# Patient Record
Sex: Female | Born: 1935 | Race: White | Hispanic: No | Smoking: Former smoker
Health system: Southern US, Community
[De-identification: ages and names within clinical notes are randomized; demographics above are authoritative.]

## PROBLEM LIST (undated history)

## (undated) DIAGNOSIS — J449 Chronic obstructive pulmonary disease, unspecified: Secondary | ICD-10-CM

## (undated) DIAGNOSIS — I10 Essential (primary) hypertension: Secondary | ICD-10-CM

## (undated) DIAGNOSIS — I4891 Unspecified atrial fibrillation: Secondary | ICD-10-CM

## (undated) DIAGNOSIS — Z950 Presence of cardiac pacemaker: Secondary | ICD-10-CM

## (undated) HISTORY — DX: Unspecified atrial fibrillation: I48.91

## (undated) HISTORY — PX: HERNIA REPAIR: SHX51

## (undated) HISTORY — PX: LAPAROSCOPIC HYSTERECTOMY: SHX1926

## (undated) HISTORY — PX: PHRENIC NERVE PACEMAKER IMPLANTATION: SHX2237

## (undated) HISTORY — DX: Essential (primary) hypertension: I10

## (undated) HISTORY — DX: Presence of cardiac pacemaker: Z95.0

## (undated) HISTORY — PX: CHOLECYSTECTOMY: SHX55

---

## 2008-11-27 HISTORY — PX: PACEMAKER PLACEMENT: SHX43

## 2013-09-27 DEATH — deceased

## 2015-08-26 ENCOUNTER — Ambulatory Visit (INDEPENDENT_AMBULATORY_CARE_PROVIDER_SITE_OTHER): Payer: Medicare Other | Admitting: Cardiology

## 2015-08-26 ENCOUNTER — Encounter: Payer: Self-pay | Admitting: Cardiology

## 2015-08-26 ENCOUNTER — Encounter: Payer: Self-pay | Admitting: *Deleted

## 2015-08-26 VITALS — BP 108/60 | HR 77 | Ht 60.0 in | Wt 103.4 lb

## 2015-08-26 DIAGNOSIS — Z45010 Encounter for checking and testing of cardiac pacemaker pulse generator [battery]: Secondary | ICD-10-CM

## 2015-08-26 DIAGNOSIS — Z79899 Other long term (current) drug therapy: Secondary | ICD-10-CM | POA: Diagnosis not present

## 2015-08-26 DIAGNOSIS — I495 Sick sinus syndrome: Secondary | ICD-10-CM | POA: Diagnosis not present

## 2015-08-26 DIAGNOSIS — I480 Paroxysmal atrial fibrillation: Secondary | ICD-10-CM | POA: Diagnosis not present

## 2015-08-26 DIAGNOSIS — I4891 Unspecified atrial fibrillation: Secondary | ICD-10-CM | POA: Insufficient documentation

## 2015-08-26 DIAGNOSIS — I10 Essential (primary) hypertension: Secondary | ICD-10-CM | POA: Diagnosis not present

## 2015-08-26 DIAGNOSIS — I482 Chronic atrial fibrillation, unspecified: Secondary | ICD-10-CM

## 2015-08-26 LAB — CUP PACEART INCLINIC DEVICE CHECK
Battery Remaining Longevity: 0 mo
Date Time Interrogation Session: 20160929144354
Lead Channel Impedance Value: 342 Ohm
Lead Channel Pacing Threshold Amplitude: 1.25 V
Lead Channel Pacing Threshold Pulse Width: 0.4 ms
Lead Channel Setting Sensing Sensitivity: 1.5 mV
MDC IDC MSMT BATTERY IMPEDANCE: 24300 Ohm
MDC IDC MSMT BATTERY VOLTAGE: 2.51 V
MDC IDC MSMT LEADCHNL RA IMPEDANCE VALUE: 318 Ohm
MDC IDC MSMT LEADCHNL RA PACING THRESHOLD AMPLITUDE: 0.75 V
MDC IDC MSMT LEADCHNL RA PACING THRESHOLD PULSEWIDTH: 0.4 ms
MDC IDC MSMT LEADCHNL RA SENSING INTR AMPL: 5 mV
MDC IDC SET LEADCHNL RA PACING AMPLITUDE: 2 V
MDC IDC SET LEADCHNL RV PACING AMPLITUDE: 2.75 V
MDC IDC SET LEADCHNL RV PACING PULSEWIDTH: 0.4 ms
MDC IDC STAT BRADY RA PERCENT PACED: 52 %
MDC IDC STAT BRADY RV PERCENT PACED: 99 %
Pulse Gen Model: 5816
Pulse Gen Serial Number: 7006169

## 2015-08-26 LAB — BASIC METABOLIC PANEL
BUN: 22 mg/dL (ref 6–23)
CHLORIDE: 104 meq/L (ref 96–112)
CO2: 29 meq/L (ref 19–32)
CREATININE: 0.57 mg/dL (ref 0.40–1.20)
Calcium: 9 mg/dL (ref 8.4–10.5)
GFR: 106.9 mL/min (ref >60.00)
Glucose, Bld: 79 mg/dL (ref 70–99)
POTASSIUM: 4 meq/L (ref 3.5–5.1)
Sodium: 138 mEq/L (ref 135–145)

## 2015-08-26 NOTE — Patient Instructions (Signed)
Medication Instructions:  Your physician recommends that you continue on your current medications as directed. Please refer to the Current Medication list given to you today.  Labwork: Today: BMET  Your physician recommends that you return for pre procedure lab work on 09/10/15.   Testing/Procedures: Your physician has recommended that you have a pacemaker generator change.  Please see the instruction sheet given to you today for more information.  Follow-Up: Your physician recommends that you schedule a follow-up appointment in: 10-14 days after pacemaker change on 09/13/15.  Any Other Special Instructions Will Be Listed Below (If Applicable). Here is a list of anticoagulations medications.  Please check on prices and call office with medication you would like to start. : Xarelto Eliquis Savaysa Pradaxa Coumadin   Thank you for choosing [facility]!!   Dory Horn, RN [phone]    Pacemaker Battery Change A pacemaker battery usually lasts 4 to 12 years. Once or twice per year, you will be asked to visit your health care provider to have a full evaluation of your pacemaker. When a battery needs to be replaced, the entire pacemaker is replaced so that you can benefit from new circuitry and any new features that have been added to pacemakers. Most often, this procedure is very simple because the leads are already in place.  There are many things that affect how long a pacemaker battery will last, including:   The age of the pacemaker.   The number of leads (1, 2, or 3).   The pacemaker work load. If the pacemaker is helping the heart more often, the battery will not last as long as it would if the pacemaker did not need to help the heart.   Power (voltage) settings. LET [facility] CARE PROVIDER KNOW ABOUT:   Any allergies you have.   All medicines you are taking, including vitamins, herbs, eye drops, creams, and over-the-counter medicines.   Previous  problems you or members of your family have had with the use of anesthetics.   Any blood disorders you have.   Previous surgeries you have had, especially since your last pacemaker placement.   Medical conditions you have.   Possibility of pregnancy, if this applies.  Symptoms of chest pain, trouble breathing, palpitations, light-headedness, or feelings of an abnormal or irregular heartbeat. RISKS AND COMPLICATIONS  Generally, this is a safe procedure. However, as with any procedure, problems can occur and include:   Bleeding.   Bruising of the skin around where the incision was made.   Pain at the incision site.   Pulling apart of the skin at the incision site.   Infection.   Allergic reaction to anesthetics or other medicines used during the procedure.  People with diabetes may have a temporary increase in their blood sugar after any surgical procedure.  BEFORE THE PROCEDURE   Wash all of the skin around the area of the chest where the pacemaker is located.   Ask your health care provider for help with any medicine adjustments before the pacemaker is replaced.   Do not eat or drink anything after midnight on the night before the procedure or as directed by your health care provider.  Ask your health care provider if you can take a sip of water with any approved medicines the morning of the procedure. PROCEDURE   After giving medicine to numb the skin (local anesthetic), your health care provider will make a cut to reopen the pocket holding the pacemaker.   The old  pacemaker will be disconnected from its leads.   The leads will be tested.   If needed, the leads will be replaced. If the leads are functioning properly, the new pacemaker may be connected to the existing leads.  A heart monitor and the pacemaker programmer will be used to make sure that the new pacemaker is working properly.  The incision site will then be closed. A dressing will be placed  over the pacemaker site. The dressing will be removed 24-48 hours afterward. AFTER THE PROCEDURE   You will be taken to a recovery area after the new pacemaker implant is completed. Your vital signs such as blood pressure, heart rate, breathing, and oxygen levels will be monitored.  Your health care provider will tell you when you will need to next test your pacemaker or when to return to the office for follow-up for removal of stitches. Document Released: 02/21/2007 Document Revised: 03/30/2014 Document Reviewed: 05/28/2013 [facility] Patient Information 2015 [place], [place]. This information is not intended to replace advice given to you by your health care provider. Make sure you discuss any questions you have with your health care provider.

## 2015-08-26 NOTE — Progress Notes (Signed)
Electrophysiology Office Note   Date:  08/26/2015   ID:  Brooke Curry, DOB [birth year], MRN [id number]  PCP:  PROVIDER NOT IN SYSTEM  Primary Electrophysiologist: Gabriele Zwilling Jorja Loa, MD    No chief complaint on file.    History of Present Illness: Brooke Curry is a 81 y.o. female who presents today for electrophysiology evaluation.   On 9/22, she presented to the hospital with shortness of breath and was found to have CAP.  She was at that time found to have a cavitary lung lesion.  She had washout of the lesion and cultures sent.  She was sent to SNF.  Her PPM was routinely checked and found to be ERI since 6/16.  The St. Jude dual chamber pacemaker was implanted in 2010 for sick sinus syndrome.  She also has AF and has had AV node ablation in 2014.     Today, she denies symptoms of palpitations, chest pain, shortness of breath, orthopnea, PND, lower extremity edema, claudication, dizziness, presyncope, syncope, bleeding, or neurologic sequela. The patient is tolerating medications without difficulties and is otherwise without complaint today.  She is feeling much improved since her hospitalization.  She is at a SNF in town and is planning on moving to [place] in the future to be closer to family.  Her last dose of antibiotics Cahterine Heinzel be on Sunday.  She is in a wheelchair today but is usually up and around without difficulties.  She is able to walk to the bathroom currently.       Past Medical History  Diagnosis Date  . Pacemaker   . A-fib   . Hypertension    Past Surgical History  Procedure Laterality Date  . Phrenic nerve pacemaker implantation    . Laparoscopic hysterectomy    . Cholecystectomy    . Pacemaker placement  2010  . Hernia repair       Current Outpatient Prescriptions  Medication Sig Dispense Refill  . aspirin 81 MG tablet Take 81 mg by mouth daily.    . cefUROXime (CEFTIN) 500 MG tablet Take 500 mg by mouth 2 (two) times daily with a meal.    . LORazepam  (ATIVAN) 0.5 MG tablet Take 0.5 mg by mouth every 8 (eight) hours.    . metoprolol tartrate (LOPRESSOR) 25 MG tablet Take 25 mg by mouth 2 (two) times daily.    . Multiple Vitamins-Minerals (MULTI COMPLETE PO) Take 1 capsule by mouth daily.    . pantoprazole (PROTONIX) 40 MG tablet Take 40 mg by mouth daily.    . polyethylene glycol (MIRALAX / GLYCOLAX) packet Take 17 g by mouth daily.    Marland Kitchen senna (SENOKOT) 8.6 MG tablet Take 1 tablet by mouth daily.     No current facility-administered medications for this visit.    Allergies:   Review of patient's allergies indicates no known allergies.   Social History:  The patient  reports that she has quit smoking. She does not have any smokeless tobacco history on file. She reports that she drinks alcohol. She reports that she uses illicit drugs.   Family History:  The patient's family history includes Cancer in her brother and mother; Heart disease in her brother and father.    ROS:  Please see the history of present illness.   Otherwise, review of systems is positive for shortness of breath with exertion.   All other systems are reviewed and negative.    PHYSICAL EXAM: VS:  BP 108/60 mmHg  Pulse 77  Ht 5' (1.524 m)  Wt 103 lb 6.4 oz (46.902 kg)  BMI 20.19 kg/m2 , BMI Body mass index is 20.19 kg/(m^2). GEN: Well nourished, well developed, in no acute distress HEENT: normal Neck: no JVD, carotid bruits, or masses Cardiac: RRR; no murmurs, rubs, or gallops,no edema  Respiratory:  clear to auscultation bilaterally, normal work of breathing GI: soft, nontender, nondistended, + BS MS: no deformity or atrophy Skin: warm and dry, device pocket is well healed Neuro:  Strength and sensation are intact Psych: euthymic mood, full affect  EKG:  EKG is ordered today. The ekg ordered today shows sinus rhythm, V paced  Device interrogation is reviewed today in detail.  See PaceArt for details.   7% AF   Recent Labs: No results found for requested  labs within last 365 days.    Lipid Panel  No results found for: CHOL, TRIG, HDL, CHOLHDL, VLDL, LDLCALC, LDLDIRECT   Wt Readings from Last 3 Encounters:  08/26/15 103 lb 6.4 oz (46.902 kg)      Other studies Reviewed: Additional studies/ records that were reviewed today include: TTE 08/15/15  Review of the above records today demonstrates: Mild-mod AI, PASP 50, EF 65%,    ASSESSMENT AND PLAN:  1.  Symptomatic sick sinus syndrome: S/p St. Jude dual chamber pacemaker in 2010 with AV nodal ablation in 2014.  Device is currently at [facility].  2. Atrial fibrillation: CHADS2 VA2Sc score of 4 which puts her at risk of stroke.  Had discussion on risks of stroke and she would like st start on anticoagulation.  Her and her family would like to begin some sort of anticoagulation.  Have given them information on anticoagulants and prices.  She Joellen Tullos discuss with her family and figure out a good medication.  Miriam Kestler get a BMP to check kidney function.  This patients CHA2DS2-VASc Score and unadjusted Ischemic Stroke Rate (% per year) is equal to 4.8 % stroke rate/year from a score of 4  Above score calculated as 1 point each if present [CHF, HTN, DM, Vascular=MI/PAD/Aortic Plaque, Age if 65-74, or Female] Above score calculated as 2 points each if present [Age > 75, or Stroke/TIA/TE]  Current medicines are reviewed at length with the patient today.   The patient does not have concerns regarding her medicines.  The following changes were made today:  Sent home with information for anticoagulation  Labs/ tests ordered today include: generator change, BMP  No orders of the defined types were placed in this encounter.     Disposition:   FU with Sheelah Ritacco post gen change 3 months  Signed, Ernesta Trabert Jorja Loa, MD  08/26/2015 12:28 PM     [facility] [address] [place] [address] [phone] (office) [phone] (fax)

## 2015-09-01 ENCOUNTER — Telehealth: Payer: Self-pay | Admitting: Cardiology

## 2015-09-01 NOTE — Telephone Encounter (Signed)
New message      Talk to [facility] regarding upcoming procedure schedule for 10-17.  Son is aware Brooke Curry is out today, OK to wait until she returns.

## 2015-09-02 NOTE — Telephone Encounter (Signed)
Pt's son called needing to cancel PPM generator change on 10/17.   Pt's mother got out of [facility] rehab early and he does not have a room for her to stay here in.  So she is going back to [facility] for now and will have procedure there.   Son apologized several times for this and I assured him this was ok and as long as patient is being taken care of, that is the most important thing, no matter where that is.  They will call us once patient has relocated back to this area and we will re-establish f/u then.

## 2015-09-03 NOTE — Telephone Encounter (Signed)
Dr. Elberta Fortis notified

## 2015-09-10 ENCOUNTER — Other Ambulatory Visit: Payer: Medicare Other

## 2015-09-13 ENCOUNTER — Encounter (HOSPITAL_COMMUNITY): Admission: RE | Payer: Self-pay

## 2015-09-13 ENCOUNTER — Ambulatory Visit (HOSPITAL_COMMUNITY): Admission: RE | Admit: 2015-09-13 | Payer: Medicare Other

## 2015-09-13 SURGERY — PPM/BIV PPM GENERATOR CHANGEOUT
Anesthesia: LOCAL

## 2015-09-22 ENCOUNTER — Encounter: Payer: Self-pay | Admitting: Cardiology

## 2015-09-23 ENCOUNTER — Ambulatory Visit: Payer: Medicare Other

## 2015-09-23 ENCOUNTER — Encounter: Payer: Self-pay | Admitting: Cardiology

## 2015-10-15 ENCOUNTER — Emergency Department (HOSPITAL_COMMUNITY): Payer: Medicare Other

## 2015-10-15 ENCOUNTER — Encounter (HOSPITAL_COMMUNITY): Payer: Self-pay | Admitting: *Deleted

## 2015-10-15 ENCOUNTER — Emergency Department (HOSPITAL_COMMUNITY)
Admission: EM | Admit: 2015-10-15 | Discharge: 2015-10-16 | Disposition: A | Discharge status: Home / Self Care | Payer: Medicare Other | Attending: Emergency Medicine | Admitting: Emergency Medicine

## 2015-10-15 DIAGNOSIS — J439 Emphysema, unspecified: Secondary | ICD-10-CM | POA: Diagnosis not present

## 2015-10-15 DIAGNOSIS — I10 Essential (primary) hypertension: Secondary | ICD-10-CM | POA: Insufficient documentation

## 2015-10-15 DIAGNOSIS — R0902 Hypoxemia: Secondary | ICD-10-CM | POA: Diagnosis not present

## 2015-10-15 DIAGNOSIS — Z79899 Other long term (current) drug therapy: Secondary | ICD-10-CM | POA: Insufficient documentation

## 2015-10-15 DIAGNOSIS — I4891 Unspecified atrial fibrillation: Secondary | ICD-10-CM | POA: Insufficient documentation

## 2015-10-15 DIAGNOSIS — Z87891 Personal history of nicotine dependence: Secondary | ICD-10-CM | POA: Insufficient documentation

## 2015-10-15 DIAGNOSIS — Z950 Presence of cardiac pacemaker: Secondary | ICD-10-CM | POA: Insufficient documentation

## 2015-10-15 DIAGNOSIS — Z7982 Long term (current) use of aspirin: Secondary | ICD-10-CM | POA: Diagnosis not present

## 2015-10-15 DIAGNOSIS — R0602 Shortness of breath: Secondary | ICD-10-CM | POA: Diagnosis present

## 2015-10-15 LAB — CBC WITH DIFFERENTIAL/PLATELET
BASOS ABS: 0 10*3/uL (ref 0.0–0.1)
Basophils Relative: 0 %
EOS ABS: 0 10*3/uL (ref 0.0–0.7)
EOS PCT: 1 %
HCT: 39 % (ref 36.0–46.0)
Hemoglobin: 12.5 g/dL (ref 12.0–15.0)
LYMPHS PCT: 8 %
Lymphs Abs: 0.6 10*3/uL — ABNORMAL LOW (ref 0.7–4.0)
MCH: 31.4 pg (ref 26.0–34.0)
MCHC: 32.1 g/dL (ref 30.0–36.0)
MCV: 98 fL (ref 78.0–100.0)
Monocytes Absolute: 1 10*3/uL (ref 0.1–1.0)
Monocytes Relative: 13 %
Neutro Abs: 6 10*3/uL (ref 1.7–7.7)
Neutrophils Relative %: 78 %
PLATELETS: 100 10*3/uL — AB (ref 150–400)
RBC: 3.98 MIL/uL (ref 3.87–5.11)
RDW: 14.9 % (ref 11.5–15.5)
WBC: 7.7 10*3/uL (ref 4.0–10.5)

## 2015-10-15 LAB — COMPREHENSIVE METABOLIC PANEL
ALT: 26 U/L (ref 14–54)
AST: 39 U/L (ref 15–41)
Albumin: 3 g/dL — ABNORMAL LOW (ref 3.5–5.0)
Alkaline Phosphatase: 92 U/L (ref 38–126)
Anion gap: 10 (ref 5–15)
BILIRUBIN TOTAL: 1 mg/dL (ref 0.3–1.2)
BUN: 20 mg/dL (ref 6–20)
CO2: 24 mmol/L (ref 22–32)
CREATININE: 0.66 mg/dL (ref 0.44–1.00)
Calcium: 8.7 mg/dL — ABNORMAL LOW (ref 8.9–10.3)
Chloride: 100 mmol/L — ABNORMAL LOW (ref 101–111)
Glucose, Bld: 113 mg/dL — ABNORMAL HIGH (ref 65–99)
POTASSIUM: 4.2 mmol/L (ref 3.5–5.1)
Sodium: 134 mmol/L — ABNORMAL LOW (ref 135–145)
TOTAL PROTEIN: 6.7 g/dL (ref 6.5–8.1)

## 2015-10-15 LAB — I-STAT TROPONIN, ED: TROPONIN I, POC: 0.01 ng/mL (ref 0.00–0.08)

## 2015-10-15 MED ORDER — ALBUTEROL SULFATE (2.5 MG/3ML) 0.083% IN NEBU
5.0000 mg | INHALATION_SOLUTION | Freq: Once | RESPIRATORY_TRACT | Status: AC
  Administered 2015-10-15: 5 mg via RESPIRATORY_TRACT
  Filled 2015-10-15: qty 6

## 2015-10-15 MED ORDER — LORAZEPAM 0.5 MG PO TABS
0.5000 mg | ORAL_TABLET | Freq: Every day | ORAL | Status: DC
  Administered 2015-10-15: 0.5 mg via ORAL
  Filled 2015-10-15: qty 1

## 2015-10-15 MED ORDER — METOPROLOL TARTRATE 25 MG PO TABS
25.0000 mg | ORAL_TABLET | Freq: Two times a day (BID) | ORAL | Status: DC
  Administered 2015-10-15: 25 mg via ORAL
  Filled 2015-10-15: qty 1

## 2015-10-15 MED ORDER — ATORVASTATIN CALCIUM 10 MG PO TABS: 10.0000 mg | ORAL_TABLET | Freq: Every day | ORAL | Status: DC

## 2015-10-15 MED ORDER — IPRATROPIUM-ALBUTEROL 0.5-2.5 (3) MG/3ML IN SOLN
3.0000 mL | RESPIRATORY_TRACT | Status: DC | PRN
  Administered 2015-10-15 – 2015-10-16 (×2): 3 mL via RESPIRATORY_TRACT
  Filled 2015-10-15 (×2): qty 3

## 2015-10-15 MED ORDER — ASPIRIN 81 MG PO TABS: 81.0000 mg | ORAL_TABLET | Freq: Every day | ORAL | Status: DC

## 2015-10-15 MED ORDER — FAMOTIDINE 20 MG PO TABS
20.0000 mg | ORAL_TABLET | Freq: Every day | ORAL | Status: DC
  Administered 2015-10-15 – 2015-10-16 (×2): 20 mg via ORAL
  Filled 2015-10-15 (×2): qty 1

## 2015-10-15 NOTE — ED Notes (Signed)
Pt back from x-ray.

## 2015-10-15 NOTE — ED Notes (Signed)
Leotis ShamesBob Halk- Patient son would like to be notified of any pertinent information admission/discharge. cell # [phone] Antonieta PertJulie Lehn- cell # [phone]

## 2015-10-15 NOTE — ED Provider Notes (Signed)
CSN: [id number]     Arrival date & time 10/15/15  1635 History   First MD Initiated Contact with Patient 10/15/15 1639     Chief Complaint  Patient presents with  . Shortness of Breath     (Consider location/radiation/quality/duration/timing/severity/associated sxs/prior Treatment) HPI   Brooke Curry is a 81 y.o. female who is here for evaluation of shortness of breath. Shortness of breath is ongoing for many months. He arrived in [place] today, as a transfer from a facility in [place], [place]. Apparently an ambulance was summoned to the facility because she complained of shortness of breath. Reportedly, she was on nasal cannula oxygen at 2 L, at that time, and it was increased to 6 L with improvement of saturation from 87%,  to 93%. She states she has a chronic ongoing cough productive of white sputum. She denies recent fever, chills, nausea, vomiting, new weakness or dizziness. She is not having chest pain or back pain. She is taking all of her usual medications. She had a pacemaker update, placed, about one month ago. She has not had medical treatments in [facility] health system, previously. There are no other known modifying factors.   Past Medical History  Diagnosis Date  . Pacemaker   . A-fib (HCC)   . Hypertension    Past Surgical History  Procedure Laterality Date  . Phrenic nerve pacemaker implantation    . Laparoscopic hysterectomy    . Cholecystectomy    . Pacemaker placement  2010  . Hernia repair     Family History  Problem Relation Age of Onset  . Heart disease Father   . Cancer Mother   . Cancer Brother   . Heart disease Brother    Social History  Substance Use Topics  . Smoking status: Former Games developer  . Smokeless tobacco: None  . Alcohol Use: 0.0 oz/week    0 Standard drinks or equivalent per week   OB History    No data available     Review of Systems  All other systems reviewed and are negative.     Allergies  Review of patient's  allergies indicates no known allergies.  Home Medications   Prior to Admission medications   Medication Sig Start Date End Date Taking? Authorizing Provider  aspirin 81 MG tablet Take 81 mg by mouth daily.   Yes Historical Provider, MD  atorvastatin (LIPITOR) 10 MG tablet Take 10 mg by mouth at bedtime.   Yes Historical Provider, MD  Linaclotide Karlene Einstein) 145 MCG CAPS capsule Take 145 mcg by mouth daily.   Yes Historical Provider, MD  LORazepam (ATIVAN) 0.5 MG tablet Take 0.5 mg by mouth at bedtime.    Yes Historical Provider, MD  metoprolol tartrate (LOPRESSOR) 25 MG tablet Take 25 mg by mouth 2 (two) times daily.   Yes Historical Provider, MD  Multiple Vitamins-Minerals (MULTI COMPLETE PO) Take 1 capsule by mouth daily.   Yes Historical Provider, MD  ondansetron (ZOFRAN) 4 MG tablet Take 4 mg by mouth every 8 (eight) hours as needed for nausea or vomiting.   Yes Historical Provider, MD  pantoprazole (PROTONIX) 40 MG tablet Take 40 mg by mouth daily.   Yes Historical Provider, MD  polyethylene glycol (MIRALAX / GLYCOLAX) packet Take 17 g by mouth daily.   Yes Historical Provider, MD  POTASSIUM PO Take 30 mLs by mouth 2 (two) times daily. Potassium 10%  Give 30ml by mouth twice a day dilute in 8 oz of fluid give after food  Yes Historical Provider, MD  ranitidine (ZANTAC) 150 MG tablet Take 150 mg by mouth at bedtime.   Yes Historical Provider, MD  senna (SENOKOT) 8.6 MG tablet Take 1 tablet by mouth daily.   Yes Historical Provider, MD   BP 119/58 mmHg  Pulse 80  Temp(Src) 99.2 F (37.3 C) (Oral)  Resp 27  SpO2 97% Physical Exam  Constitutional: She is oriented to person, place, and time. She appears well-developed. No distress.  Elderly, frail  HENT:  Head: Normocephalic and atraumatic.  Right Ear: External ear normal.  Left Ear: External ear normal.  Eyes: Conjunctivae and EOM are normal. Pupils are equal, round, and reactive to light.  Neck: Normal range of motion and phonation  normal. Neck supple.  Cardiovascular: Normal rate, regular rhythm and normal heart sounds.   Pulmonary/Chest: Effort normal and breath sounds normal. No respiratory distress. She exhibits no bony tenderness.  There is no increased work of breathing. Air movement is fair. No audible rales, rhonchi or wheezes.  Abdominal: Soft. There is no tenderness.  Musculoskeletal: Normal range of motion. She exhibits no edema or tenderness.  Thoracic kyphosis, mild.  Neurological: She is alert and oriented to person, place, and time. No cranial nerve deficit or sensory deficit. She exhibits normal muscle tone. Coordination normal.  Skin: Skin is warm, dry and intact.  Psychiatric: She has a normal mood and affect. Her behavior is normal. Judgment and thought content normal.  Nursing note and vitals reviewed.   ED Course  Procedures (including critical care time) Medications  ipratropium-albuterol (DUONEB) 0.5-2.5 (3) MG/3ML nebulizer solution 3 mL (3 mLs Nebulization Given 10/15/15 2125)  LORazepam (ATIVAN) tablet 0.5 mg (0.5 mg Oral Given 10/15/15 2125)  metoprolol tartrate (LOPRESSOR) tablet 25 mg (25 mg Oral Given 10/15/15 2123)  famotidine (PEPCID) tablet 20 mg (20 mg Oral Given 10/15/15 2125)  albuterol (PROVENTIL) (2.5 MG/3ML) 0.083% nebulizer solution 5 mg (5 mg Nebulization Given 10/15/15 1711)    Patient Vitals for the past 24 hrs:  BP Temp Temp src Pulse Resp SpO2  10/15/15 2130 119/58 mmHg - - 80 (!) 27 97 %  10/15/15 2123 (!) 115/52 mmHg - - 86 - -  10/15/15 2115 (!) 115/52 mmHg - - 97 26 98 %  10/15/15 2100 (!) 123/52 mmHg - - 91 26 94 %  10/15/15 2045 129/65 mmHg - - 70 (!) 33 96 %  10/15/15 2030 (!) 116/54 mmHg - - 80 (!) 30 99 %  10/15/15 2015 108/60 mmHg - - 81 26 96 %  10/15/15 2000 121/56 mmHg - - 76 (!) 33 99 %  10/15/15 1945 (!) 124/51 mmHg - - 80 (!) 29 98 %  10/15/15 1930 (!) 130/54 mmHg - - - (!) 35 -  10/15/15 1915 114/99 mmHg - - - (!) 30 -  10/15/15 1908 (!) 116/49  mmHg - - 78 (!) 29 91 %  10/15/15 1900 - - - 78 (!) 37 99 %  10/15/15 1815 (!) 117/54 mmHg - - 80 (!) 29 100 %  10/15/15 1800 (!) 120/53 mmHg - - 79 26 93 %  10/15/15 1745 108/62 mmHg - - 81 (!) 29 98 %  10/15/15 1730 111/78 mmHg - - 93 (!) 31 96 %  10/15/15 1715 140/63 mmHg - - 88 23 100 %  10/15/15 1700 131/71 mmHg - - - (!) 29 -  10/15/15 1649 127/72 mmHg 99.2 F (37.3 C) Oral 89 22 100 %  10/15/15 1645 127/72 mmHg - - - (!)  27 -  10/15/15 1635 - - - - - 93 %    9:02 PM Reevaluation with update and discussion. After initial assessment and treatment, an updated evaluation reveals no change in clinical status. Findings discussed with patient and her family members, all questions were answered. Cadan Maggart L     Labs Review Labs Reviewed  COMPREHENSIVE METABOLIC PANEL - Abnormal; Notable for the following:    Sodium 134 (*)    Chloride 100 (*)    Glucose, Bld 113 (*)    Calcium 8.7 (*)    Albumin 3.0 (*)    All other components within normal limits  CBC WITH DIFFERENTIAL/PLATELET - Abnormal; Notable for the following:    Platelets 100 (*)    Lymphs Abs 0.6 (*)    All other components within normal limits  Rosezena SensorI-STAT TROPOININ, ED    Imaging Review Dg Chest 2 View  10/15/2015  CLINICAL DATA:  Shortness of breath. EXAM: CHEST  2 VIEW COMPARISON:  None. FINDINGS: Dual lead cardiac pacemaker is seen with leads overlying the right atrium and right ventricle. Cardiomediastinal silhouette is normal. Mediastinal contours appear intact. There is no evidence of focal airspace consolidation, or pneumothorax. There are increased interstitial markings on the background of emphysematous changes. There are bilateral small pleural effusions with associated subsegmental atelectasis. There is a severe anterior compression deformity of 1 of the mid thoracic vertebral bodies with unknown acuity. Multilevel osteoarthritic changes are also seen. Soft tissues are grossly normal. IMPRESSION: Bilateral  pleural effusions with bibasilar subsegmental atelectasis. Mildly increased interstitial markings on the background of emphysematous changes, which may represent an element of pulmonary edema. Anterior compression deformity of 1 of the mid thoracic vertebral bodies with unknown acuity. Electronically Signed   By: Ted Mcalpineobrinka  Dimitrova M.D.   On: 10/15/2015 18:45   I have personally reviewed and evaluated these images and lab results as part of my medical decision-making.   EKG Interpretation   Date/Time:  Friday October 15 2015 16:51:05 EST Ventricular Rate:  91 PR Interval:  155 QRS Duration: 126 QT Interval:  379 QTC Calculation: 466 R Axis:   -79 Text Interpretation:  Atrial-sensed ventricular-paced rhythm No further  analysis attempted due to paced rhythm Since last tracing of earlier today  V pacing is now visible, tracing is otherwise unchanged Confirmed by Effie ShyWENTZ   MD, Katye Valek [phone]) on 10/15/2015 4:58:55 PM       EKG Interpretation  Date/Time:  Friday October 15 2015 16:51:05 EST Ventricular Rate:  91 PR Interval:  155 QRS Duration: 126 QT Interval:  379 QTC Calculation: 466 R Axis:   -79 Text Interpretation:  Atrial-sensed ventricular-paced rhythm No further analysis attempted due to paced rhythm Since last tracing of earlier today V pacing is now visible, tracing is otherwise unchanged Confirmed by Effie ShyWENTZ  MD, Garron Eline [phone]) on 10/15/2015 4:58:55 PM         MDM   Final diagnoses:  Pulmonary emphysema, unspecified emphysema type (HCC)  Hypoxia    Hypoxia stabilized, with nasal cannula oxygen at 2 L, her normal. Shortness of breath improved with a single nebulizer treatment. Patient is in a peculiar situation of having just arrived to [place], and has not been checked into her assisted living facility yet. She came here from there, before she could be assessed. This will have to happen in the daytime hours, on the morning of 10/16/2015. She will need to have the  addition of Duoneb, to her medications, at the time of discharge.  Nursing  Notes Reviewed/ Care Coordinated, and agree without changes. Applicable Imaging Reviewed.  Interpretation of Laboratory Data incorporated into ED treatment  Plan- as per oncoming provider team, discharge to assisted living, on 10/16/2015    Mancel Bale, MD 10/16/15 1200

## 2015-10-15 NOTE — Progress Notes (Signed)
EDCM spoke to [facility] Tori who reports nursing facility will not allow patient to go back to facility this evening due to no RN available to finish admission assessment.  EDCM provided [facility]DR[facility] with [facility] number.  No further EDCM needs at this time.

## 2015-10-15 NOTE — ED Notes (Signed)
Per EMS: pt coming from [facility] with c/o shortness of breath. Pt states shortness of breath onset was over a month ago. Pt reports shortnesss of breath since being dx with PNE. Family states pt needed help and called EMS because they felt she was not getting the care she needed at the nursing home. Pt was on 2 lpm at nursing home, upon EMS arrival pt's O2 saturation was 87%, pt placed on 6 lpm [place] O2 saturation increased to 93%. No other complaints

## 2015-10-15 NOTE — ED Notes (Signed)
Pt to xray

## 2015-10-15 NOTE — ED Notes (Signed)
Pt requesting in and out cath, reports intermittent inability to fully empty bladder. In and out cath completed; urine drained. Pt repositioned in the bed; given pillow for back support.

## 2015-10-15 NOTE — ED Notes (Signed)
Pt brought over from [facility]. Pt was recently admitted to [facility]. RN at facility was attempting to completed initial assessment when nurse noticed pt's increased SOB. Pt has been medically cleared to return to facility; however, there is not an available RN at the facility to complete assessment and pt will not be able to return tonight. Pt should be able to return back to facility in the morning when staffing at facility permits

## 2015-10-15 NOTE — ED Notes (Signed)
Called xray to inform that pt breathing tx is complete and ready for transport

## 2015-10-16 DIAGNOSIS — J439 Emphysema, unspecified: Secondary | ICD-10-CM | POA: Diagnosis not present

## 2015-10-16 MED ORDER — ATORVASTATIN CALCIUM 10 MG PO TABS
10.0000 mg | ORAL_TABLET | Freq: Every day | ORAL | Status: DC
  Filled 2015-10-16: qty 1

## 2015-10-16 MED ORDER — ASPIRIN EC 81 MG PO TBEC
81.0000 mg | DELAYED_RELEASE_TABLET | Freq: Every day | ORAL | Status: DC
  Administered 2015-10-16: 81 mg via ORAL
  Filled 2015-10-16: qty 1

## 2015-10-16 MED ORDER — IPRATROPIUM-ALBUTEROL 0.5-2.5 (3) MG/3ML IN SOLN: 3.0000 mL | RESPIRATORY_TRACT | Status: DC | PRN

## 2015-10-16 MED ORDER — ONDANSETRON HCL 4 MG/2ML IJ SOLN
4.0000 mg | Freq: Once | INTRAMUSCULAR | Status: AC
  Administered 2015-10-16: 4 mg via INTRAVENOUS
  Filled 2015-10-16: qty 2

## 2015-10-16 NOTE — Discharge Instructions (Signed)
Chronic Obstructive Pulmonary Disease Chronic obstructive pulmonary disease (COPD) is a common lung condition in which airflow from the lungs is limited. COPD is a general term that can be used to describe many different lung problems that limit airflow, including both chronic bronchitis and emphysema. If you have COPD, your lung function will probably never return to normal, but there are measures you can take to improve lung function and make yourself feel better. CAUSES   Smoking (common).  Exposure to secondhand smoke.  Genetic problems.  Chronic inflammatory lung diseases or recurrent infections. SYMPTOMS  Shortness of breath, especially with physical activity.  Deep, persistent (chronic) cough with a large amount of thick mucus.  Wheezing.  Rapid breaths (tachypnea).  Gray or bluish discoloration (cyanosis) of the skin, especially in your fingers, toes, or lips.  Fatigue.  Weight loss.  Frequent infections or episodes when breathing symptoms become much worse (exacerbations).  Chest tightness. DIAGNOSIS Your health care provider will take a medical history and perform a physical examination to diagnose COPD. Additional tests for COPD may include:  Lung (pulmonary) function tests.  Chest X-ray.  CT scan.  Blood tests. TREATMENT  Treatment for COPD may include:  Inhaler and nebulizer medicines. These help manage the symptoms of COPD and make your breathing more comfortable.  Supplemental oxygen. Supplemental oxygen is only helpful if you have a low oxygen level in your blood.  Exercise and physical activity. These are beneficial for nearly all people with COPD.  Lung surgery or transplant.  Nutrition therapy to gain weight, if you are underweight.  Pulmonary rehabilitation. This may involve working with a team of health care providers and specialists, such as respiratory, occupational, and physical therapists. HOME CARE INSTRUCTIONS  Take all medicines  (inhaled or pills) as directed by your health care provider.  Avoid over-the-counter medicines or cough syrups that dry up your airway (such as antihistamines) and slow down the elimination of secretions unless instructed otherwise by your health care provider.  If you are a smoker, the most important thing that you can do is stop smoking. Continuing to smoke will cause further lung damage and breathing trouble. Ask your health care provider for help with quitting smoking. He or she can direct you to community resources or hospitals that provide support.  Avoid exposure to irritants such as smoke, chemicals, and fumes that aggravate your breathing.  Use oxygen therapy and pulmonary rehabilitation if directed by your health care provider. If you require home oxygen therapy, ask your health care provider whether you should purchase a pulse oximeter to measure your oxygen level at home.  Avoid contact with individuals who have a contagious illness.  Avoid extreme temperature and humidity changes.  Eat healthy foods. Eating smaller, more frequent meals and resting before meals may help you maintain your strength.  Stay active, but balance activity with periods of rest. Exercise and physical activity will help you maintain your ability to do things you want to do.  Preventing infection and hospitalization is very important when you have COPD. Make sure to receive all the vaccines your health care provider recommends, especially the pneumococcal and influenza vaccines. Ask your health care provider whether you need a pneumonia vaccine.  Learn and use relaxation techniques to manage stress.  Learn and use controlled breathing techniques as directed by your health care provider. Controlled breathing techniques include:  Pursed lip breathing. Start by breathing in (inhaling) through your nose for 1 second. Then, purse your lips as if you were   going to whistle and breathe out (exhale) through the  pursed lips for 2 seconds.  Diaphragmatic breathing. Start by putting one hand on your abdomen just above your waist. Inhale slowly through your nose. The hand on your abdomen should move out. Then purse your lips and exhale slowly. You should be able to feel the hand on your abdomen moving in as you exhale.  Learn and use controlled coughing to clear mucus from your lungs. Controlled coughing is a series of short, progressive coughs. The steps of controlled coughing are: 1. Lean your head slightly forward. 2. Breathe in deeply using diaphragmatic breathing. 3. Try to hold your breath for 3 seconds. 4. Keep your mouth slightly open while coughing twice. 5. Spit any mucus out into a tissue. 6. Rest and repeat the steps once or twice as needed. SEEK MEDICAL CARE IF:  You are coughing up more mucus than usual.  There is a change in the color or thickness of your mucus.  Your breathing is more labored than usual.  Your breathing is faster than usual. SEEK IMMEDIATE MEDICAL CARE IF:  You have shortness of breath while you are resting.  You have shortness of breath that prevents you from:  Being able to talk.  Performing your usual physical activities.  You have chest pain lasting longer than 5 minutes.  Your skin color is more cyanotic than usual.  You measure low oxygen saturations for longer than 5 minutes with a pulse oximeter. MAKE SURE YOU:  Understand these instructions.  Will watch your condition.  Will get help right away if you are not doing well or get worse.   This information is not intended to replace advice given to you by your health care provider. Make sure you discuss any questions you have with your health care provider.   Document Released: 08/23/2005 Document Revised: 12/04/2014 Document Reviewed: 07/10/2013 Elsevier Interactive Patient Education 2016 Elsevier Inc.  

## 2015-10-16 NOTE — ED Notes (Signed)
Son talking w/Joyce, SW.

## 2015-10-16 NOTE — Clinical Social Work Note (Signed)
Clinical Social Work Assessment  Patient Details  Name: Brooke PennerMary Rigg MRN: [id number] Date of Birth: 1927-04-26  Date of referral:  10/16/15               Reason for consult:  Facility Placement                Permission sought to share information with:  Case Manager, Family Supports Permission granted to share information::  Yes, Verbal Permission Granted  Name::     Leotis ShamesBob Auguste  Agency::     Relationship::  Son  Contact Information:  [phone]  Housing/Transportation Living arrangements for the past 2 months:  Skilled Nursing Facility, Assisted Living Facility Source of Information:  Adult Children, Patient Patient Interpreter Needed:  None Criminal Activity/Legal Involvement Pertinent to Current Situation/Hospitalization:  No - Comment as needed Significant Relationships:  Adult Children Lives with:  Facility Resident Do you feel safe going back to the place where you live?  Yes Need for family participation in patient care:  Yes (Comment)  Care giving concerns:  Son Leotis ShamesBob Moon spoke with CSW regarding patient transferring from ALF to SNF.  Social Worker assessment / plan:  CSW went to speak with patient regarding discharge planning. CSW introduced self and acknowledged the patient. Patient is alert and oriented. Patient was calm and cooperative with CSW assessment. Son Leotis ShamesBob Maler was at bedside. Patient provided CSW with permission to speak while family is in room. Son Leotis ShamesBob Marchiano spoke with CSW regarding patient transferring from ALF to SNF. Son reports patient began experiencing some shortness of breath before being admitted into [facility]. Son informed CSW that patient was told she needs skilled nursing placement rather than ALF. Son reports that he would like the patient to be placed [facility] where she had been about a month ago. CSW informed son that CSW will follow up with facility, however, can not guarantee possible placement on today. Son  reports he is willing to take patient home of [facility] is not available for patient on today.   Employment status:  Disabled (Comment on whether or not currently receiving Disability) Insurance information:  Medicare PT Recommendations:  Not assessed at this time Information / Referral to community resources:     Patient/Family's Response to care:  Son is aware of patient's progress and possible discharge on today. Son is supportive and is hopeful that patient will receive the care she needs.   Patient/Family's Understanding of and Emotional Response to Diagnosis, Current Treatment, and Prognosis:  Patient and family is aware and understanding of current treatment and prognosis. Son voiced that he would have liked for the patient to go to a skilled nursing facility. However he is willing to take the pt home until placement is available [facility].    Emotional Assessment Appearance:  Appears stated age Attitude/Demeanor/Rapport:   (Calm and Cooperative ) Affect (typically observed):  Accepting, Appropriate, Calm Orientation:  Oriented to Self, Oriented to Place Alcohol / Substance use:  Not Applicable Psych involvement (Current and /or in the community):  No (Comment)  Discharge Needs  Concerns to be addressed:    Readmission within the last 30 days:  No Current discharge risk:  None Barriers to Discharge:  Barriers Resolved   Loleta DickerJoyce S Donnice Nielsen, LCSW 10/16/2015, 10:43 AM

## 2015-10-16 NOTE — ED Notes (Signed)
Pt's son has returned w/pt's portable O2 tank. Advised pt's belongings were being moved from nsg facility to his home. States he will be ready to transport home in approx 1 hour.

## 2015-10-16 NOTE — ED Notes (Signed)
Pt c/o nausea; obtained verbal zofran order

## 2015-10-16 NOTE — Care Management Note (Signed)
Case Management Note  Patient Details  Name: Brooke PennerMary Curry MRN: [id number] Date of Birth: Apr 15, 1927  Subjective/Objective:  81 y.o. F [facility] for c/o SOB, while she was being admitted to [facility] on 10/15/2015. [place] and just moved here, per Notes. When medically stable and ready to return to facility there was no admission staff available to accept her. Therefore she had to stay in the ED overnight. CSW aware and will assist with transfer to facility today.                    Action/Plan: Discharge to AL facility today. No CM needs at present but will be available should disposition/discharge needs arise.    Expected Discharge Date:                  Expected Discharge Plan:  Assisted Living / Rest Home  In-House Referral:  Clinical Social Work  Discharge planning Services  CM Consult  Post Acute Care Choice:    Choice offered to:     DME Arranged:    DME Agency:     HH Arranged:    HH Agency:     Status of Service:  Completed, signed off  Medicare Important Message Given:    Date Medicare IM Given:    Medicare IM give by:    Date Additional Medicare IM Given:    Additional Medicare Important Message give by:     If discussed at Long Length of Stay Meetings, dates discussed:    Additional Comments:  Yvone NeuCrutchfield, Larell Baney M, RN 10/16/2015, 9:08 AM

## 2015-10-16 NOTE — ED Notes (Signed)
Pt assisted to recliner for comfort; c/o worsening chronic back pain. Reports improvement of comfort after sitting up

## 2015-10-16 NOTE — ED Notes (Signed)
Josh, EMT, assisted pt w/dressing.

## 2015-11-01 ENCOUNTER — Encounter (HOSPITAL_COMMUNITY): Payer: Self-pay

## 2015-11-01 ENCOUNTER — Emergency Department (HOSPITAL_COMMUNITY): Payer: Medicare Other

## 2015-11-01 ENCOUNTER — Inpatient Hospital Stay (HOSPITAL_COMMUNITY): Payer: Medicare Other

## 2015-11-01 ENCOUNTER — Inpatient Hospital Stay (HOSPITAL_COMMUNITY)
Admission: EM | Admit: 2015-11-01 | Discharge: 2015-11-05 | DRG: 176 | Disposition: A | Discharge status: Nursing Home (Includes ICF) | Payer: Medicare Other | Attending: Family Medicine | Admitting: Family Medicine

## 2015-11-01 DIAGNOSIS — Z79899 Other long term (current) drug therapy: Secondary | ICD-10-CM | POA: Diagnosis not present

## 2015-11-01 DIAGNOSIS — Z950 Presence of cardiac pacemaker: Secondary | ICD-10-CM

## 2015-11-01 DIAGNOSIS — E876 Hypokalemia: Secondary | ICD-10-CM | POA: Diagnosis present

## 2015-11-01 DIAGNOSIS — J449 Chronic obstructive pulmonary disease, unspecified: Secondary | ICD-10-CM | POA: Diagnosis present

## 2015-11-01 DIAGNOSIS — I4891 Unspecified atrial fibrillation: Secondary | ICD-10-CM | POA: Diagnosis present

## 2015-11-01 DIAGNOSIS — I495 Sick sinus syndrome: Secondary | ICD-10-CM | POA: Diagnosis present

## 2015-11-01 DIAGNOSIS — D61818 Other pancytopenia: Secondary | ICD-10-CM | POA: Diagnosis present

## 2015-11-01 DIAGNOSIS — I10 Essential (primary) hypertension: Secondary | ICD-10-CM | POA: Diagnosis present

## 2015-11-01 DIAGNOSIS — Y95 Nosocomial condition: Secondary | ICD-10-CM | POA: Insufficient documentation

## 2015-11-01 DIAGNOSIS — Z87891 Personal history of nicotine dependence: Secondary | ICD-10-CM

## 2015-11-01 DIAGNOSIS — I482 Chronic atrial fibrillation: Secondary | ICD-10-CM | POA: Diagnosis present

## 2015-11-01 DIAGNOSIS — I2699 Other pulmonary embolism without acute cor pulmonale: Principal | ICD-10-CM | POA: Insufficient documentation

## 2015-11-01 DIAGNOSIS — R64 Cachexia: Secondary | ICD-10-CM | POA: Diagnosis present

## 2015-11-01 DIAGNOSIS — Z681 Body mass index (BMI) 19 or less, adult: Secondary | ICD-10-CM | POA: Diagnosis not present

## 2015-11-01 DIAGNOSIS — R0600 Dyspnea, unspecified: Secondary | ICD-10-CM | POA: Diagnosis not present

## 2015-11-01 DIAGNOSIS — R062 Wheezing: Secondary | ICD-10-CM

## 2015-11-01 DIAGNOSIS — Z8701 Personal history of pneumonia (recurrent): Secondary | ICD-10-CM | POA: Diagnosis not present

## 2015-11-01 DIAGNOSIS — J189 Pneumonia, unspecified organism: Secondary | ICD-10-CM | POA: Insufficient documentation

## 2015-11-01 LAB — I-STAT ARTERIAL BLOOD GAS, ED
Bicarbonate: 22.7 mEq/L (ref 20.0–24.0)
O2 Saturation: 93 %
PCO2 ART: 30.1 mmHg — AB (ref 35.0–45.0)
PO2 ART: 61 mmHg — AB (ref 80.0–100.0)
Patient temperature: 98.6
TCO2: 24 mmol/L (ref 0–100)
pH, Arterial: 7.485 — ABNORMAL HIGH (ref 7.350–7.450)

## 2015-11-01 LAB — CBC WITH DIFFERENTIAL/PLATELET
BASOS ABS: 0 10*3/uL (ref 0.0–0.1)
Basophils Relative: 0 %
Eosinophils Absolute: 0 10*3/uL (ref 0.0–0.7)
Eosinophils Relative: 1 %
HEMATOCRIT: 36 % (ref 36.0–46.0)
Hemoglobin: 11.8 g/dL — ABNORMAL LOW (ref 12.0–15.0)
LYMPHS ABS: 0.4 10*3/uL — AB (ref 0.7–4.0)
LYMPHS PCT: 9 %
MCH: 31.6 pg (ref 26.0–34.0)
MCHC: 32.8 g/dL (ref 30.0–36.0)
MCV: 96.5 fL (ref 78.0–100.0)
Monocytes Absolute: 0 10*3/uL — ABNORMAL LOW (ref 0.1–1.0)
Monocytes Relative: 1 %
NEUTROS ABS: 3.4 10*3/uL (ref 1.7–7.7)
Neutrophils Relative %: 89 %
Platelets: 95 10*3/uL — ABNORMAL LOW (ref 150–400)
RBC: 3.73 MIL/uL — AB (ref 3.87–5.11)
RDW: 15.8 % — ABNORMAL HIGH (ref 11.5–15.5)
WBC: 3.8 10*3/uL — AB (ref 4.0–10.5)

## 2015-11-01 LAB — COMPREHENSIVE METABOLIC PANEL
ALK PHOS: 65 U/L (ref 38–126)
ALT: 17 U/L (ref 14–54)
AST: 22 U/L (ref 15–41)
Albumin: 2.9 g/dL — ABNORMAL LOW (ref 3.5–5.0)
Anion gap: 10 (ref 5–15)
BILIRUBIN TOTAL: 0.7 mg/dL (ref 0.3–1.2)
BUN: 15 mg/dL (ref 6–20)
CALCIUM: 8.7 mg/dL — AB (ref 8.9–10.3)
CHLORIDE: 108 mmol/L (ref 101–111)
CO2: 23 mmol/L (ref 22–32)
CREATININE: 0.62 mg/dL (ref 0.44–1.00)
GFR calc Af Amer: >60 mL/min (ref >60)
Glucose, Bld: 150 mg/dL — ABNORMAL HIGH (ref 65–99)
Potassium: 3.3 mmol/L — ABNORMAL LOW (ref 3.5–5.1)
Sodium: 141 mmol/L (ref 135–145)
TOTAL PROTEIN: 6.2 g/dL — AB (ref 6.5–8.1)

## 2015-11-01 LAB — I-STAT TROPONIN, ED: Troponin i, poc: 0 ng/mL (ref 0.00–0.08)

## 2015-11-01 LAB — BRAIN NATRIURETIC PEPTIDE: B NATRIURETIC PEPTIDE 5: 320.5 pg/mL — AB (ref 0.0–100.0)

## 2015-11-01 LAB — TROPONIN I

## 2015-11-01 LAB — I-STAT CG4 LACTIC ACID, ED: LACTIC ACID, VENOUS: 1.97 mmol/L (ref 0.5–2.0)

## 2015-11-01 MED ORDER — METOPROLOL TARTRATE 12.5 MG HALF TABLET
12.5000 mg | ORAL_TABLET | Freq: Two times a day (BID) | ORAL | Status: DC
  Administered 2015-11-01 – 2015-11-05 (×8): 12.5 mg via ORAL
  Filled 2015-11-01 (×8): qty 1

## 2015-11-01 MED ORDER — VITAMIN D3 25 MCG (1000 UNIT) PO TABS
1000.0000 [IU] | ORAL_TABLET | Freq: Every day | ORAL | Status: DC
  Administered 2015-11-02 – 2015-11-05 (×4): 1000 [IU] via ORAL
  Filled 2015-11-01 (×8): qty 1

## 2015-11-01 MED ORDER — POLYETHYLENE GLYCOL 3350 17 G PO PACK
17.0000 g | PACK | Freq: Every day | ORAL | Status: DC | PRN
  Administered 2015-11-03: 17 g via ORAL
  Filled 2015-11-01: qty 1

## 2015-11-01 MED ORDER — HEPARIN BOLUS VIA INFUSION
2500.0000 [IU] | Freq: Once | INTRAVENOUS | Status: AC
  Administered 2015-11-01: 2500 [IU] via INTRAVENOUS
  Filled 2015-11-01: qty 2500

## 2015-11-01 MED ORDER — ALBUTEROL SULFATE (2.5 MG/3ML) 0.083% IN NEBU: 2.5000 mg | INHALATION_SOLUTION | RESPIRATORY_TRACT | Status: DC | PRN

## 2015-11-01 MED ORDER — VANCOMYCIN HCL IN DEXTROSE 1-5 GM/200ML-% IV SOLN: 1000.0000 mg | Freq: Once | INTRAVENOUS | Status: DC

## 2015-11-01 MED ORDER — DOCUSATE SODIUM 100 MG PO CAPS
100.0000 mg | ORAL_CAPSULE | Freq: Two times a day (BID) | ORAL | Status: DC
  Administered 2015-11-02 – 2015-11-05 (×7): 100 mg via ORAL
  Filled 2015-11-01 (×7): qty 1

## 2015-11-01 MED ORDER — MELATONIN 3 MG PO TABS
3.0000 mg | ORAL_TABLET | Freq: Every day | ORAL | Status: DC
  Administered 2015-11-01 – 2015-11-04 (×4): 3 mg via ORAL
  Filled 2015-11-01 (×6): qty 1

## 2015-11-01 MED ORDER — SODIUM CHLORIDE 0.9 % IV SOLN: 250.0000 mL | INTRAVENOUS | Status: DC | PRN

## 2015-11-01 MED ORDER — ACETAMINOPHEN 650 MG RE SUPP: 650.0000 mg | Freq: Four times a day (QID) | RECTAL | Status: DC | PRN

## 2015-11-01 MED ORDER — FAMOTIDINE 20 MG PO TABS
20.0000 mg | ORAL_TABLET | Freq: Every day | ORAL | Status: DC
  Administered 2015-11-02 – 2015-11-05 (×4): 20 mg via ORAL
  Filled 2015-11-01 (×4): qty 1

## 2015-11-01 MED ORDER — HEPARIN (PORCINE) IN NACL 100-0.45 UNIT/ML-% IJ SOLN
750.0000 [IU]/h | INTRAMUSCULAR | Status: DC
  Administered 2015-11-01: 600 [IU]/h via INTRAVENOUS
  Filled 2015-11-01: qty 250

## 2015-11-01 MED ORDER — SODIUM CHLORIDE 0.9 % IJ SOLN: 3.0000 mL | INTRAMUSCULAR | Status: DC | PRN

## 2015-11-01 MED ORDER — MIRTAZAPINE 7.5 MG PO TABS
3.7500 mg | ORAL_TABLET | Freq: Every day | ORAL | Status: DC
  Administered 2015-11-01 – 2015-11-04 (×4): 3.75 mg via ORAL
  Filled 2015-11-01 (×5): qty 1

## 2015-11-01 MED ORDER — FUROSEMIDE 10 MG/ML IJ SOLN
40.0000 mg | Freq: Once | INTRAMUSCULAR | Status: AC
  Administered 2015-11-01: 40 mg via INTRAVENOUS
  Filled 2015-11-01: qty 4

## 2015-11-01 MED ORDER — PREDNISONE 50 MG PO TABS
50.0000 mg | ORAL_TABLET | Freq: Every day | ORAL | Status: DC
  Administered 2015-11-02 – 2015-11-05 (×4): 50 mg via ORAL
  Filled 2015-11-01 (×4): qty 1

## 2015-11-01 MED ORDER — IPRATROPIUM-ALBUTEROL 0.5-2.5 (3) MG/3ML IN SOLN
3.0000 mL | RESPIRATORY_TRACT | Status: DC
  Administered 2015-11-02 (×6): 3 mL via RESPIRATORY_TRACT
  Filled 2015-11-01 (×6): qty 3

## 2015-11-01 MED ORDER — PIPERACILLIN-TAZOBACTAM 3.375 G IVPB 30 MIN: 3.3750 g | Freq: Once | INTRAVENOUS | Status: DC

## 2015-11-01 MED ORDER — IOHEXOL 350 MG/ML SOLN
80.0000 mL | Freq: Once | INTRAVENOUS | Status: AC | PRN
  Administered 2015-11-01: 80 mL via INTRAVENOUS

## 2015-11-01 MED ORDER — SODIUM CHLORIDE 0.9 % IJ SOLN
3.0000 mL | Freq: Two times a day (BID) | INTRAMUSCULAR | Status: DC
  Administered 2015-11-03 – 2015-11-05 (×4): 3 mL via INTRAVENOUS

## 2015-11-01 MED ORDER — ACETAMINOPHEN 325 MG PO TABS
650.0000 mg | ORAL_TABLET | Freq: Four times a day (QID) | ORAL | Status: DC | PRN
  Administered 2015-11-03: 650 mg via ORAL
  Filled 2015-11-01: qty 2

## 2015-11-01 MED ORDER — PANTOPRAZOLE SODIUM 40 MG PO TBEC
40.0000 mg | DELAYED_RELEASE_TABLET | Freq: Every day | ORAL | Status: DC
  Administered 2015-11-02 – 2015-11-05 (×4): 40 mg via ORAL
  Filled 2015-11-01 (×4): qty 1

## 2015-11-01 MED ORDER — MELATONIN 5 MG PO TABS: 5.0000 mg | ORAL_TABLET | Freq: Every day | ORAL | Status: DC

## 2015-11-01 MED ORDER — SODIUM CHLORIDE 0.9 % IJ SOLN
3.0000 mL | Freq: Two times a day (BID) | INTRAMUSCULAR | Status: DC
  Administered 2015-11-01 – 2015-11-05 (×7): 3 mL via INTRAVENOUS

## 2015-11-01 NOTE — Progress Notes (Signed)
Received report on patient from Vicente SereneGabriel, RN from ED.

## 2015-11-01 NOTE — Progress Notes (Signed)
NURSING PROGRESS NOTE  Brooke PennerMary Curry  MRN: [id number]  Admission Data: 11/01/2015  9:01 PM   Attending Provider: Moses MannersWilliam A Hensel, MD  PCP: Herschel SenegalHABER, MICHELE, MD  Code status: FULL  Allergies: No Known Allergies   Past Medical History:  has a past medical history of Pacemaker; A-fib (HCC); and Hypertension.   Past Surgical History:  has past surgical history that includes Phrenic nerve pacemaker implantation; Laparoscopic hysterectomy; Cholecystectomy; pacemaker placement (2010); and Hernia repair.   Brooke PennerMary Curry is a 81 y.o.  female patient, arrived to floor in room [phone] via stretcher, transferred from ED. [facility]. Patient alert and oriented X 4. No acute distress noted. Denies pain.   Vital signs: Oral temperature 99.4 F (37.4 C), Blood pressure 138/61, Pulse 97, RR 20, SpO2 97 % on 3L nasal cannula. Height 5' (152.4 cm), weight 110 lbs (49.896 kg).   Cardiac monitoring: Telemetry box 5W #  05 with continuous pulse oximetry in place.  IV access: Left antecubital; condition patent and no redness.  Skin: intact, no pressure ulcer noted in sacral area.   Patient's ID armband verified with patient/ family, and in place. Information packet given to patient/ family. Fall risk assessed, SR up X2, patient/ family able to verbalize understanding of risks associated with falls and to call nurse or staff to assist before getting out of bed. Patient/ family oriented to room and equipment. Call bell within reach.

## 2015-11-01 NOTE — Progress Notes (Signed)
ANTICOAGULATION CONSULT NOTE - Initial Consult  Pharmacy Consult for heparin Indication: pulmonary embolus  No Known Allergies  Patient Measurements: Height: 5' (152.4 cm) Weight: 101 lb (45.813 kg) IBW/kg (Calculated) : 45.5 Heparin Dosing Weight: 45.5kg  Vital Signs: Temp: 97.5 F (36.4 C) (12/05 1644) Temp Source: Oral (12/05 1644) BP: 135/65 mmHg (12/05 1945) Pulse Rate: 95 (12/05 1945)  Labs:  Recent Labs  11/01/15 1755  HGB 11.8*  HCT 36.0  PLT 95*  CREATININE 0.62    Estimated Creatinine Clearance: 36.3 mL/min (by C-G formula based on Cr of 0.62).   Assessment: [age] YOF here with SOB worsening x1 day. Also with anterior chest soreness. Of note, she has new lower extremity swelling x3 days, however had dopplers done 2 days ago which showed no DVT. CXR shows worsening infiltrates, nml WBC.   To start heparin as concern for PE present. CTA to be performed. Baseline Hgb 11.8, plts 95. No bleeding noted. She is not on anticoagulation PTA.  Goal of Therapy:  Heparin level 0.3-0.7 units/ml Monitor platelets by anticoagulation protocol: Yes   Plan:  -heparin bolus with 2500 units IV x1, then start infusion at 600 units/hr -HL in 8h -daily HL and CBC -follow results of CTA, clinical progression, s/s bleeding  [name] D. Malaysha Arlen, PharmD, BCPS Clinical Pharmacist Pager: [phone] 11/01/2015 8:56 PM

## 2015-11-01 NOTE — ED Notes (Signed)
Per EMS: [facility]. Recently diagnosed with pneumonia, new pacemaker placement. Pt complaining of SOB, received 2 nebulizer treatments at home, EMS gave a total of 10mg  albuterol and 0.5 atrovent. On site physician gave 2 oral predinsones. Pt a/ox4 able to form complete sentences on arrival.

## 2015-11-01 NOTE — H&P (Signed)
Family Medicine Teaching [facility] Admission History and Physical Service Pager: [phone]  Patient name: Brooke Curry Medical record number: [id number] Date of birth: July 29, 1927 Age: 81 y.o. Gender: female  Primary Care Provider: [name], MD Consultants: None Code Status: FULL  Chief Complaint: Dyspnea  Assessment and Plan: Brooke Curry is a 82 y.o. female presenting with dyspnea . PMH is significant for COPD, HTN, and a.fib.  Dyspnea: Worsening. As patient is afebrile with no cough, PNA is less likely. Findings seen on CXR likely reflect incomplete resolution of prior PNA, chronic scarring, or fluid present in fissues, rather than new or worsening PNA. Increased BNP, crackles heard on lung exam, and LE edema could reflect CHF as etiology, though patient has no history of this, she does have chronic a fib. History of unilateral LE swelling as well as recently immobility raise concern for PE. Wells Score 7.5, placing patient in high risk group. Differential also includes cardiac etiology of SOB, as MIs often present atypically in women.  - Admit to telemetry, attending Dr. Leveda Anna - CTA to rule out PE - will also look for possible infiltrates not well defined on CXR - Begin heparin drip in likelihood of PE - can d/c for VTE ppx dose heparin/lovenox if CTA neg for PE - IV Lasix 40 mg x1. Can repeat in AM if necessary. F/u diuresis - Daily weights and I/O's - Continuous pulse ox, O2 prn (home is 2L) - Trend troponins, repeat EKG to rule out ACS - Echo to evaluate for possible HF - Duonebs q4 scheduled, PRN albuterol - Continue prednisone burst (started by patient's PCP outpatient) - confirm start date to clarify stop date - As patient is afebrile with no leukocytosis, will not begin abx at this time.  - Consider palliative care consult once acute processes have been ruled out - PCP has started discussion with family  LE edema: Was primarily unilateral, prompting Doppler US at ALF  two days ago. Doppler showed no signs of DVT, per PCP. Improved greatly, but 1+ edema to mid-shin with trace to knee still present. No erythema or cords noted or tenderness to palpation.  - Continue to monitor  Pancytopenia: mild, but changed from previously.  Fluid overload with dilutional effect could explain partially - Continue to monitor, especially in light of heparin use.  COPD:  Patient's PCP began prednisone burst prior to admission for potential exacerbation.  - Scheduled Duonebs q4h - Cont prednisone burst - clarify with PCP when medication was started, so we will know stop date  Chronic A.fib: Not currently anticoagulated at home. Patient has pacemaker.  - Continue home Lopressor  HTN: BP Stable on admission - Continue home Lopressor  FEN/GI: heart healthy diet, SLIV Prophylaxis: heparin drip  Disposition: admit to floor with telemetry, dispo pending clinical improvement and possible palliative care goals of care discussion  History of Present Illness:  Brooke Curry is a 81 y.o. female presenting with dyspnea.   Patient reports increasing shortness of breath for the past few weeks, especially upon exertion. She reports that it has been increasingly worse in the past few days. She has also started to experience a soreness in her ribs associated with exertion. She was seen by her geriatrician this AM, who was concerned for a PE. She gave her a breathing treatment with minimal improvement, and then called EMS. EMS administered two more breathing treatments with mild improvement of wheezing. Patient was then transported to ED.   Denies cough, chills or fever. Does report  producing a small amount of green sputum this morning.  Has been receiving Duonebs 4 times daily for the past month. Is not currently using any inhalers or other breathing mediations. She is on 2L O2 at home. Denies chest pain or palpitations.   The patient also noticed swelling primarily in her R lower leg two days  ago. A Doppler ultrasound was performed at her ALF, which showed no DVT. The patient reports being more immobile than usual in the past few months.   From discussion with PCP, Patient's health has declined fairly rapidly in the past 6 months. 6 months ago, she was living alone, driving, and taking care of herself entirely. Since then she has been hospitalized and treated with antibiotics for PNA three times. She also now lives in an assisted living facilities. She recently had her pacemaker replaced.   Review Of Systems: Per HPI with the following additions: endorses some abdominal fullness but no abdominal pain; denies constipation, diarrhea, dysuria. Otherwise the remainder of the systems were negative.  Patient Active Problem List   Diagnosis Date Noted  . Dyspnea 11/01/2015  . Essential hypertension 08/26/2015  . Sick sinus syndrome (HCC) 08/26/2015  . Atrial fibrillation (HCC) 08/26/2015    Past Medical History: Past Medical History  Diagnosis Date  . Pacemaker   . A-fib (HCC)   . Hypertension     Past Surgical History: Past Surgical History  Procedure Laterality Date  . Phrenic nerve pacemaker implantation    . Laparoscopic hysterectomy    . Cholecystectomy    . Pacemaker placement  2010  . Hernia repair      Social History: Social History  Substance Use Topics  . Smoking status: Former Games developermoker  . Smokeless tobacco: None  . Alcohol Use: 0.0 oz/week    0 Standard drinks or equivalent per week   Additional social history: Patient reports smoking on and off for "many" years (reports beginning to smoke "a little" as a teenager but will not provide specific number of years). Patient drinks 3 oz wine daily and does not use any illicit drugs.  Please also refer to relevant sections of EMR.  Family History: Family History  Problem Relation Age of Onset  . Heart disease Father   . Cancer Mother   . Cancer Brother   . Heart disease Brother     Allergies and  Medications: No Known Allergies No current facility-administered medications on file prior to encounter.   Current Outpatient Prescriptions on File Prior to Encounter  Medication Sig Dispense Refill  . LORazepam (ATIVAN) 0.5 MG tablet Take 0.5 mg by mouth every 8 (eight) hours as needed for anxiety.     . metoprolol tartrate (LOPRESSOR) 25 MG tablet Take 12.5 mg by mouth 2 (two) times daily.     . ondansetron (ZOFRAN) 4 MG tablet Take 4 mg by mouth every 8 (eight) hours as needed for nausea or vomiting.    . polyethylene glycol (MIRALAX / GLYCOLAX) packet Take 17 g by mouth daily as needed for mild constipation.     . ranitidine (ZANTAC) 150 MG tablet Take 150 mg by mouth at bedtime.      Objective: BP 138/61 mmHg  Pulse 97  Temp(Src) 99.4 F (37.4 C) (Oral)  Resp 20  Ht 5' (1.524 m)  Wt 101 lb (45.813 kg)  BMI 19.73 kg/m2  SpO2 97% Exam: General: pleasant, cachectic female lying in bed in NAD Eyes: PERRLA, EOMI ENTM: MMM, OP clear Cardiovascular: distant heart sounds, regular  rate, no murmurs appreciated, extremities warm and well-perfused, 1+ pitting edema in R LE with no edema in L Respiratory: diffuse crackles most prominent in bilateral lung bases, no wheezing, able to speak in complete sentences, no retractions or use of accessory muscles of breathing Abdomen: soft, non-tender, non-distended, +BS MSK: moving all extremities spontaneously Skin: no rashes or bruises noted Neuro: A&Ox3, no focal deficits Psych: appropriate mood and affect   Labs and Imaging: CBC BMET   Recent Labs Lab 11/01/15 1755  WBC 3.8*  HGB 11.8*  HCT 36.0  PLT 95*    Recent Labs Lab 11/01/15 1755  NA 141  K 3.3*  CL 108  CO2 23  BUN 15  CREATININE 0.62  GLUCOSE 150*  CALCIUM 8.7*     BNP 320.5  Troponin 0.00  Lactic acid 1.97  EKG: Afib, but regular rate 2/2 ventricular pacing, IVCD, nonischemic, grossly unchanged from previous  Dg Chest Port 1 View  11/01/2015  CLINICAL  DATA:  Chest and back pain x 1 week w/ sob. pna in Sept 2016. Bronchitis 2 wks. No fever. No n/v. Htn. No diab. Ex smoker. No hx mi or stroke. No hx tb. No jaw, neck, or arm pain. EXAM: PORTABLE CHEST - 1 VIEW COMPARISON:  10/15/2015 FINDINGS: Progressive airspace consolidation in the left upper lobe peripherally. Some increase in airspace opacities at the right lung base. Heart size normal. Atheromatous aorta. Left subclavian pacemaker stable. Persistent blunting of lateral costophrenic angles as before. No pneumothorax. Visualized skeletal structures are unremarkable. IMPRESSION: 1. Worsening airspace disease in the left upper lobe and right lower lobes since previous exam. Electronically Signed   By: Corlis Leak M.D.   On: 11/01/2015 17:03    Marquette Saa, MD 11/01/2015, 8:54 PM PGY-1, [place] [facility] FPTS Intern pager: [phone], text pages welcome   Upper Level Addendum:  I have seen and evaluated this patient along with Dr. Natale Milch and reviewed the above note, making necessary revisions in pink.  Erasmo Downer, MD, MPH PGY-2,  [facility] 11/01/2015 9:37 PM

## 2015-11-01 NOTE — ED Provider Notes (Signed)
CSN: [id number]     Arrival date & time 11/01/15  1618 History   First MD Initiated Contact with Patient 11/01/15 1630     Chief Complaint  Patient presents with  . Shortness of Breath     (Consider location/radiation/quality/duration/timing/severity/associated sxs/prior Treatment) HPI Patient presents by EMS for acute worsening of her shortness of breath over the last day. She complains of wheezing and anterior chest soreness. She denies any fever or chills. No new lower extremity swelling or pain. She is a nursing home patient and was seen by the nursing home physician. Was given prednisone and nebulized treatment without provement. EMS has given patient 2 nebulized treatments with improvement of wheezing. Vital signs were stable en route. Patient has been on 3 L of nasal cannula oxygen for the past 2 months since she was admitted to the hospital with pneumonia. Past Medical History  Diagnosis Date  . Pacemaker   . A-fib (HCC)   . Hypertension    Past Surgical History  Procedure Laterality Date  . Phrenic nerve pacemaker implantation    . Laparoscopic hysterectomy    . Cholecystectomy    . Pacemaker placement  2010  . Hernia repair     Family History  Problem Relation Age of Onset  . Heart disease Father   . Cancer Mother   . Cancer Brother   . Heart disease Brother    Social History  Substance Use Topics  . Smoking status: Former Games developermoker  . Smokeless tobacco: None  . Alcohol Use: 0.0 oz/week    0 Standard drinks or equivalent per week   OB History    No data available     Review of Systems  Constitutional: Negative for fever and chills.  HENT: Negative for congestion.   Respiratory: Positive for shortness of breath. Negative for cough.   Cardiovascular: Positive for chest pain. Negative for palpitations and leg swelling.  Gastrointestinal: Negative for nausea, vomiting, abdominal pain and diarrhea.  Genitourinary: Negative for dysuria, hematuria, flank pain and  difficulty urinating.  Musculoskeletal: Negative for myalgias, back pain, neck pain and neck stiffness.  Neurological: Negative for dizziness, weakness, light-headedness, numbness and headaches.  All other systems reviewed and are negative.     Allergies  Review of patient's allergies indicates no known allergies.  Home Medications   Prior to Admission medications   Medication Sig Start Date End Date Taking? Authorizing Provider  aspirin 81 MG tablet Take 81 mg by mouth daily.    Historical Provider, MD  atorvastatin (LIPITOR) 10 MG tablet Take 10 mg by mouth at bedtime.    Historical Provider, MD  ipratropium-albuterol (DUONEB) 0.5-2.5 (3) MG/3ML SOLN Take 3 mLs by nebulization every 4 (four) hours as needed. 10/16/15   Nelva Nayobert Beaton, MD  Linaclotide [facility](LINZESS) 145 MCG CAPS capsule Take 145 mcg by mouth daily.    Historical Provider, MD  LORazepam (ATIVAN) 0.5 MG tablet Take 0.5 mg by mouth at bedtime.     Historical Provider, MD  metoprolol tartrate (LOPRESSOR) 25 MG tablet Take 25 mg by mouth 2 (two) times daily.    Historical Provider, MD  Multiple Vitamins-Minerals (MULTI COMPLETE PO) Take 1 capsule by mouth daily.    Historical Provider, MD  ondansetron (ZOFRAN) 4 MG tablet Take 4 mg by mouth every 8 (eight) hours as needed for nausea or vomiting.    Historical Provider, MD  pantoprazole (PROTONIX) 40 MG tablet Take 40 mg by mouth daily.    Historical Provider, MD  polyethylene glycol (  MIRALAX / GLYCOLAX) packet Take 17 g by mouth daily.    Historical Provider, MD  POTASSIUM PO Take 30 mLs by mouth 2 (two) times daily. Potassium 10%  Give 30ml by mouth twice a day dilute in 8 oz of fluid give after food    Historical Provider, MD  ranitidine (ZANTAC) 150 MG tablet Take 150 mg by mouth at bedtime.    Historical Provider, MD  senna (SENOKOT) 8.6 MG tablet Take 1 tablet by mouth daily.    Historical Provider, MD   BP 135/63 mmHg  Temp(Src) 97.5 F (36.4 C) (Oral)  Resp 28  Ht 5'  (1.524 m)  Wt 101 lb (45.813 kg)  BMI 19.73 kg/m2  SpO2 96% Physical Exam  Constitutional: She is oriented to person, place, and time. She appears well-developed and well-nourished. No distress.  HENT:  Head: Normocephalic and atraumatic.  Mouth/Throat: Oropharynx is clear and moist. No oropharyngeal exudate.  Eyes: EOM are normal. Pupils are equal, round, and reactive to light.  Neck: Normal range of motion. Neck supple. No JVD present.  Cardiovascular: Regular rhythm.  Exam reveals no gallop and no friction rub.   No murmur heard. Irregular irregular  Pulmonary/Chest: No respiratory distress. She has wheezes. She has no rales.  Increased respiratory effort. Decreased air movement on the right side with end expiratory wheezing.  Abdominal: Soft. Bowel sounds are normal. She exhibits distension. She exhibits no mass. There is no tenderness. There is no rebound and no guarding.  Generalized abdominal distention without focal tenderness, rebound or guarding.  Musculoskeletal: Normal range of motion. She exhibits no edema or tenderness.  No CVA tenderness bilaterally. Patient does have right-sided lower extremity swelling compared to left. Distal pulses are intact. No calf tenderness.  Neurological: She is alert and oriented to person, place, and time.  Moves all extremities without deficit. Sensation is fully intact.  Skin: Skin is warm and dry. No rash noted. No erythema.  Psychiatric: She has a normal mood and affect. Her behavior is normal.  Nursing note and vitals reviewed.   ED Course  Procedures (including critical care time) Labs Review Labs Reviewed  CBC WITH DIFFERENTIAL/PLATELET  COMPREHENSIVE METABOLIC PANEL  BRAIN NATRIURETIC PEPTIDE  I-STAT TROPOININ, ED    Imaging Review Dg Chest Port 1 View  11/01/2015  CLINICAL DATA:  Chest and back pain x 1 week w/ sob. pna in Sept 2016. Bronchitis 2 wks. No fever. No n/v. Htn. No diab. Ex smoker. No hx mi or stroke. No hx tb. No  jaw, neck, or arm pain. EXAM: PORTABLE CHEST - 1 VIEW COMPARISON:  10/15/2015 FINDINGS: Progressive airspace consolidation in the left upper lobe peripherally. Some increase in airspace opacities at the right lung base. Heart size normal. Atheromatous aorta. Left subclavian pacemaker stable. Persistent blunting of lateral costophrenic angles as before. No pneumothorax. Visualized skeletal structures are unremarkable. IMPRESSION: 1. Worsening airspace disease in the left upper lobe and right lower lobes since previous exam. Electronically Signed   By: Corlis Leak M.D.   On: 11/01/2015 17:03   I have personally reviewed and evaluated these images and lab results as part of my medical decision-making.   EKG Interpretation   Date/Time:  Monday November 01 2015 16:37:17 EST Ventricular Rate:  80 PR Interval:    QRS Duration: 129 QT Interval:  414 QTC Calculation: 478 R Axis:   -77 Text Interpretation:  Atrial fibrillation IVCD, consider atypical RBBB LVH  with IVCD, LAD and secondary repol abnrm Probable RV  involvement, suggest  recording right precordial leads Confirmed by Ranae Palms  MD, Crew Goren ([id number])  on 11/01/2015 4:49:33 PM Also confirmed by Ranae Palms  MD, Sebrena Engh ([id number])  on  11/01/2015 5:04:25 PM      MDM   Final diagnoses:  None   Patient states right lower extremity swelling is new since 3 days ago. Had a Doppler ultrasound of the leg which showed no DVT 2 days ago. Patient maintaining airway and emergency department. Continues to have mild increased work of breathing the wheezing is significantly improved. Patient with worsening infiltrates on chest x-ray. She has a mild elevation in her BNP. She has a normal white blood cell count. Likely multifactorial cause for patient's dyspnea. Blood cultures and lactic acid drawn. Discussed with family medicine resident, Dr. Natale Milch. Will admit to telemetry bed under Dr. Leveda Anna.    Loren Racer, MD 11/01/15 Ernestina Columbia

## 2015-11-02 ENCOUNTER — Inpatient Hospital Stay (HOSPITAL_COMMUNITY): Payer: Medicare Other

## 2015-11-02 DIAGNOSIS — I2699 Other pulmonary embolism without acute cor pulmonale: Secondary | ICD-10-CM | POA: Insufficient documentation

## 2015-11-02 DIAGNOSIS — J189 Pneumonia, unspecified organism: Secondary | ICD-10-CM | POA: Insufficient documentation

## 2015-11-02 DIAGNOSIS — R0600 Dyspnea, unspecified: Secondary | ICD-10-CM

## 2015-11-02 DIAGNOSIS — Y95 Nosocomial condition: Secondary | ICD-10-CM

## 2015-11-02 LAB — CBC
HCT: 37.7 % (ref 36.0–46.0)
Hemoglobin: 11.9 g/dL — ABNORMAL LOW (ref 12.0–15.0)
MCH: 30.6 pg (ref 26.0–34.0)
MCHC: 31.6 g/dL (ref 30.0–36.0)
MCV: 96.9 fL (ref 78.0–100.0)
PLATELETS: 134 10*3/uL — AB (ref 150–400)
RBC: 3.89 MIL/uL (ref 3.87–5.11)
RDW: 15.6 % — AB (ref 11.5–15.5)
WBC: 2.6 10*3/uL — AB (ref 4.0–10.5)

## 2015-11-02 LAB — BASIC METABOLIC PANEL
ANION GAP: 8 (ref 5–15)
BUN: 15 mg/dL (ref 6–20)
CHLORIDE: 101 mmol/L (ref 101–111)
CO2: 29 mmol/L (ref 22–32)
Calcium: 8.8 mg/dL — ABNORMAL LOW (ref 8.9–10.3)
Creatinine, Ser: 0.76 mg/dL (ref 0.44–1.00)
GFR calc Af Amer: >60 mL/min (ref >60)
GFR calc non Af Amer: >60 mL/min (ref >60)
GLUCOSE: 172 mg/dL — AB (ref 65–99)
POTASSIUM: 3.1 mmol/L — AB (ref 3.5–5.1)
Sodium: 138 mmol/L (ref 135–145)

## 2015-11-02 LAB — HEPARIN LEVEL (UNFRACTIONATED): Heparin Unfractionated: 0.2 IU/mL — ABNORMAL LOW (ref 0.30–0.70)

## 2015-11-02 LAB — TROPONIN I: Troponin I: <0.03 ng/mL (ref <0.031)

## 2015-11-02 MED ORDER — APIXABAN 5 MG PO TABS: 10.0000 mg | ORAL_TABLET | Freq: Two times a day (BID) | ORAL | Status: DC

## 2015-11-02 MED ORDER — HEPARIN BOLUS VIA INFUSION
1000.0000 [IU] | Freq: Once | INTRAVENOUS | Status: AC
  Administered 2015-11-02: 1000 [IU] via INTRAVENOUS
  Filled 2015-11-02: qty 1000

## 2015-11-02 MED ORDER — POTASSIUM CHLORIDE CRYS ER 20 MEQ PO TBCR
40.0000 meq | EXTENDED_RELEASE_TABLET | Freq: Once | ORAL | Status: AC
  Administered 2015-11-02: 40 meq via ORAL
  Filled 2015-11-02: qty 2

## 2015-11-02 MED ORDER — CETYLPYRIDINIUM CHLORIDE 0.05 % MT LIQD
7.0000 mL | Freq: Two times a day (BID) | OROMUCOSAL | Status: DC
  Administered 2015-11-02 – 2015-11-04 (×3): 7 mL via OROMUCOSAL

## 2015-11-02 MED ORDER — PIPERACILLIN-TAZOBACTAM 3.375 G IVPB 30 MIN
3.3750 g | Freq: Once | INTRAVENOUS | Status: AC
  Administered 2015-11-02: 3.375 g via INTRAVENOUS
  Filled 2015-11-02: qty 50

## 2015-11-02 MED ORDER — VANCOMYCIN HCL IN DEXTROSE 750-5 MG/150ML-% IV SOLN
750.0000 mg | Freq: Once | INTRAVENOUS | Status: AC
  Administered 2015-11-02: 750 mg via INTRAVENOUS
  Filled 2015-11-02: qty 150

## 2015-11-02 MED ORDER — VANCOMYCIN HCL IN DEXTROSE 750-5 MG/150ML-% IV SOLN
750.0000 mg | INTRAVENOUS | Status: DC
Start: 2015-11-03 — End: 2015-11-03
  Administered 2015-11-03: 750 mg via INTRAVENOUS
  Filled 2015-11-02: qty 150

## 2015-11-02 MED ORDER — PIPERACILLIN-TAZOBACTAM 3.375 G IVPB
3.3750 g | Freq: Three times a day (TID) | INTRAVENOUS | Status: DC
  Administered 2015-11-02 – 2015-11-03 (×4): 3.375 g via INTRAVENOUS
  Filled 2015-11-02 (×5): qty 50

## 2015-11-02 MED ORDER — APIXABAN 5 MG PO TABS
10.0000 mg | ORAL_TABLET | Freq: Two times a day (BID) | ORAL | Status: DC
Start: 2015-11-02 — End: 2015-11-05
  Administered 2015-11-02 – 2015-11-05 (×7): 10 mg via ORAL
  Filled 2015-11-02 (×7): qty 2

## 2015-11-02 MED ORDER — IPRATROPIUM-ALBUTEROL 0.5-2.5 (3) MG/3ML IN SOLN
3.0000 mL | Freq: Four times a day (QID) | RESPIRATORY_TRACT | Status: DC
  Administered 2015-11-03 – 2015-11-04 (×6): 3 mL via RESPIRATORY_TRACT
  Filled 2015-11-02 (×6): qty 3

## 2015-11-02 MED ORDER — APIXABAN 5 MG PO TABS: 5.0000 mg | ORAL_TABLET | Freq: Two times a day (BID) | ORAL | Status: DC

## 2015-11-02 MED ORDER — IBUPROFEN 400 MG PO TABS
400.0000 mg | ORAL_TABLET | ORAL | Status: DC | PRN
  Administered 2015-11-02: 400 mg via ORAL
  Filled 2015-11-02: qty 1

## 2015-11-02 NOTE — Discharge Instructions (Addendum)
You were hospitalized for a pulmonary embolism, a blood clot in your lungs, causing you to have difficulty breathing. You were started on anticoagulation which will help to stop clots from forming in the future. It is important that you take the anticoagulant, Eliquis, daily. You will take 10 mg twice per day until 12/13. After 12/13, you will begin to take 5 mg twice per day. Continue this until directed otherwise by Dr. Leanor RubensteinHaber.    Information on my medicine - ELIQUIS (apixaban)  This medication education was reviewed with me or my healthcare representative as part of my discharge preparation.  The pharmacist that spoke with me during my hospital stay was:  Elwin Sleightowell, Lisa Kay, [facility]  Why was Eliquis prescribed for you? Eliquis was prescribed to treat blood clots that may have been found in the veins of your legs (deep vein thrombosis) or in your lungs (pulmonary embolism) and to reduce the risk of them occurring again.  What do You need to know about Eliquis ? The starting dose is 10 mg (two 5 mg tablets) taken TWICE daily for the FIRST SEVEN (7) DAYS, then the dose is reduced to ONE 5 mg tablet taken TWICE daily.  Eliquis may be taken with or without food.   Try to take the dose about the same time in the morning and in the evening. If you have difficulty swallowing the tablet whole please discuss with your pharmacist how to take the medication safely.  Take Eliquis exactly as prescribed and DO NOT stop taking Eliquis without talking to the doctor who prescribed the medication.  Stopping may increase your risk of developing a new blood clot.  Refill your prescription before you run out.  After discharge, you should have regular check-up appointments with your healthcare provider that is prescribing your Eliquis.    What do you do if you miss a dose? If a dose of ELIQUIS is not taken at the scheduled time, take it as soon as possible on the same day and twice-daily administration should be  resumed. The dose should not be doubled to make up for a missed dose.  Important Safety Information A possible side effect of Eliquis is bleeding. You should call your healthcare provider right away if you experience any of the following: ? Bleeding from an injury or your nose that does not stop. ? Unusual colored urine (red or dark brown) or unusual colored stools (red or black). ? Unusual bruising for unknown reasons. ? A serious fall or if you hit your head (even if there is no bleeding).  Some medicines may interact with Eliquis and might increase your risk of bleeding or clotting while on Eliquis. To help avoid this, consult your healthcare provider or pharmacist prior to using any new prescription or non-prescription medications, including herbals, vitamins, non-steroidal anti-inflammatory drugs (NSAIDs) and supplements.  This website has more information on Eliquis (apixaban): http://www.eliquis.com/eliquis/home

## 2015-11-02 NOTE — Evaluation (Signed)
Occupational Therapy Evaluation Patient Details Name: Brooke Curry MRN: [id number] DOB: 04/09/1927 Today's Date: 11/02/2015    History of Present Illness this 81 y.o. female admitted with worsening dyspnea.  CXR revealed worsening airspace disease Lt upper lobe and Rt lower lobes.  CTA showed LLL PE as well as consolidations concerning for PNA.  PMH includes COPD, HTN, A-fib   Clinical Impression   Pt admitted with above. She demonstrates the below listed deficits and will benefit from continued OT to maximize safety and independence with BADLs.  Currently, pt fatigues quite rapidly with minimal exertion and requires mod - max A for ADLs due to poor activity tolerance - DOE 3/4 - 4/4, sats 96% on 3L 02.   She resides at ALF with reported caregiver assist in am, and her son and daughter in law providing 4-6 hours of assist a day.   She will likely need increased level of assist at discharge due to level of fatigue.  If ALF and family able to provide the assistance needed  , then recommend discharge back to ALF with HHOT, and an aide.  However, if they are unable to provide necessary level of care, recommend SNF.       Follow Up Recommendations  Supervision/Assistance - 24 hour;SNF    Equipment Recommendations  None recommended by OT    Recommendations for Other Services       Precautions / Restrictions Precautions Precautions: Fall Restrictions Weight Bearing Restrictions: No      Mobility Bed Mobility Overal bed mobility: Needs Assistance Bed Mobility: Supine to Sit;Sit to Supine     Supine to sit: Min guard;Min assist Sit to supine: Min assist   General bed mobility comments: assist for LEs   Transfers Overall transfer level: Needs assistance Equipment used: Rolling walker (2 wheeled) Transfers: Sit to/from Stand Sit to Stand: Min assist              Balance Overall balance assessment: Needs assistance Sitting-balance support: Feet supported Sitting  balance-Leahy Scale: Fair                                      ADL Overall ADL's : Needs assistance/impaired Eating/Feeding: Independent   Grooming: Wash/dry hands;Wash/dry face;Oral care;Brushing hair;Set up;Sitting   Upper Body Bathing: Minimal assitance;Sitting   Lower Body Bathing: Maximal assistance;Bed level   Upper Body Dressing : Moderate assistance;Sitting   Lower Body Dressing: Total assistance;Sit to/from stand   Toilet Transfer: Minimal assistance;Stand-pivot;BSC;RW   Toileting- Clothing Manipulation and Hygiene: Moderate assistance;Sit to/from stand       Functional mobility during ADLs: Minimal assistance;Rolling walker General ADL Comments: Pt with very limited activity tolerance DOE 4/4 with sats 96% on 3L 02 with minimal exertion/activity      Vision     Perception     Praxis      Pertinent Vitals/Pain Pain Assessment: No/denies pain     Hand Dominance Right   Extremity/Trunk Assessment Upper Extremity Assessment Upper Extremity Assessment: Generalized weakness   Lower Extremity Assessment Lower Extremity Assessment: Defer to PT evaluation   Cervical / Trunk Assessment Cervical / Trunk Assessment: Kyphotic   Communication Communication Communication: No difficulties   Cognition Arousal/Alertness: Awake/alert Behavior During Therapy: WFL for tasks assessed/performed Overall Cognitive Status: No family/caregiver present to determine baseline cognitive functioning  General Comments       Exercises       Shoulder Instructions      Home Living Family/patient expects to be discharged to:: Assisted living                             Home Equipment: Walker - 2 wheels;Shower seat   Additional Comments: Pt resided at [facility], where she has been for just a short period of time       Prior Functioning/Environment Level of Independence: Needs assistance  Gait / Transfers  Assistance Needed: Pt ambulated in her room only and required freqent rest breaks.  She reports she requires use of w/c to go to dining room  ADL's / Homemaking Assistance Needed: Pt reports she has assist with LB ADLs, and breakfast prep.  She reports that she has lunch and dinner in dining area   Comments: Pt has an aide available in the mornings, her daughter in law for 3-4 hours, and son for 1-3 hours/day.   Pt provided contradictory information throughout eval so uncertain of the accuracy of information     OT Diagnosis: Generalized weakness   OT Problem List: Decreased strength;Decreased activity tolerance;Impaired balance (sitting and/or standing);Decreased knowledge of use of DME or AE;Cardiopulmonary status limiting activity   OT Treatment/Interventions: Self-care/ADL training;Therapeutic exercise;Therapeutic activities;Patient/family education;Balance training;DME and/or AE instruction    OT Goals(Current goals can be found in the care plan section) Acute Rehab OT Goals Patient Stated Goal: to get stronger  OT Goal Formulation: With patient Time For Goal Achievement: 11/16/15 Potential to Achieve Goals: Good ADL Goals Pt Will Perform Upper Body Bathing: with set-up;sitting Pt Will Perform Lower Body Bathing: with min assist;sit to/from stand Pt Will Perform Upper Body Dressing: with set-up;sitting Pt Will Transfer to Toilet: with min guard assist;ambulating;regular height toilet;bedside commode;grab bars Pt Will Perform Toileting - Clothing Manipulation and hygiene: with min guard assist;sit to/from stand  OT Frequency: Min 2X/week   Barriers to D/C: Decreased caregiver support  unsure if she will have necessary level of assist at discharge        Co-evaluation              End of Session Equipment Utilized During Treatment: Rolling walker Nurse Communication: Mobility status  Activity Tolerance: Patient limited by fatigue Patient left: in bed;with call bell/phone  within reach   Time: 1610-1640 OT Time Calculation (min): 30 min Charges:  OT General Charges $OT Visit: 1 Procedure OT Evaluation $Initial OT Evaluation Tier I: 1 Procedure OT Treatments $Therapeutic Activity: 8-22 mins G-Codes:    Brooke Curry M Nov 03, 2015, 6:28 PM

## 2015-11-02 NOTE — Progress Notes (Signed)
Radiology called regarding patient's CT angio chest result, showing left lower lobe pulmonary embolus. Family Medicine Teaching Service MD paged and notified.

## 2015-11-02 NOTE — Progress Notes (Signed)
PT Cancellation Note  Patient Details Name: Brooke Curry MRN: [id number] DOB: 08/01/1927   Cancelled Treatment:    Reason Eval/Treat Not Completed: Medical issues which prohibited therapy. Overnight CT angio of chest showing L lower lobe PE. Pt's heparin level is not therapeutic and will therefore hold off on PT eval at this time. Will check back tomorrow.  Clydie BraunKaren L. Katrinka BlazingSmith, [place]PT Pager [phone] 11/02/2015    Andress,Chong Wojdyla LUBECK 11/02/2015, 8:59 AM

## 2015-11-02 NOTE — Progress Notes (Signed)
Utilization review completed. Leanore Biggers, RN, BSN. 

## 2015-11-02 NOTE — Evaluation (Signed)
Clinical/Bedside Swallow Evaluation Patient Details  Name: Lysbeth PennerMary Sylva MRN: [id number] Date of Birth: [birth year]  Today's Date: 11/02/2015 Time: SLP Start Time (ACUTE ONLY): 1254 SLP Stop Time (ACUTE ONLY): 1310 SLP Time Calculation (min) (ACUTE ONLY): 16 min  Past Medical History:  Past Medical History  Diagnosis Date  . Pacemaker   . A-fib (HCC)   . Hypertension    Past Surgical History:  Past Surgical History  Procedure Laterality Date  . Phrenic nerve pacemaker implantation    . Laparoscopic hysterectomy    . Cholecystectomy    . Pacemaker placement  2010  . Hernia repair     HPI:  Lysbeth PennerMary Gust is a 81 y.o. with history of pacemaker, COPD, A-fib, HTN, pna x 3 and hernia repair admitted with presenting with dyspnea. Per MD notes, health has declined fairly rapidly in the past 6 months (6 months ago pt living alone, driving, and totally independent). Now resides in assisted living. CXR worsening airspace disease in the left upper lobe and right lower lobes since previous exam. No prior ST documentation found.   Assessment / Plan / Recommendation Clinical Impression  Mastication, manipulation and transit across consistencies is WFL's. Pharyngeal component of swallow without observable impairment. Pt has history of COPD placing her at midly higher risk for silent aspiration. Given recurrent pna's and COPD, recommend objective assessment with MBS next date. Pt in agreement. Continue regular and thin liquids and MBS 12/7.      Aspiration Risk  Moderate aspiration risk    Diet Recommendation   regular/thin; MBS 12/7  Medication Administration: Whole meds with liquid    Other  Recommendations Oral Care Recommendations: Oral care BID   Follow up Recommendations   (tbd)    Frequency and Duration            Prognosis        Swallow Study   General Date of Onset:  (six months) HPI: Lysbeth PennerMary Graumann is a 81 y.o. with history of pacemaker, COPD, A-fib, HTN, pna x 3 and hernia repair  admitted with presenting with dyspnea. Per MD notes, health has declined fairly rapidly in the past 6 months (6 months ago pt living alone, driving, and totally independent). Now resides in assisted living. CXR worsening airspace disease in the left upper lobe and right lower lobes since previous exam. No prior ST documentation found. Type of Study: Bedside Swallow Evaluation Previous Swallow Assessment: none Diet Prior to this Study: Regular;Thin liquids Temperature Spikes Noted: No Respiratory Status: Nasal cannula History of Recent Intubation: No Behavior/Cognition: Pleasant mood;Cooperative;Alert Oral Cavity Assessment: Within Functional Limits Oral Care Completed by SLP: No Oral Cavity - Dentition: Adequate natural dentition Vision: Functional for self-feeding Self-Feeding Abilities: Able to feed self Patient Positioning: Upright in bed Baseline Vocal Quality: Normal Volitional Cough: Weak Volitional Swallow: Able to elicit    Oral/Motor/Sensory Function Overall Oral Motor/Sensory Function: Within functional limits   Ice Chips Ice chips: Not tested   Thin Liquid Thin Liquid: Within functional limits Presentation: Cup;Straw    Nectar Thick Nectar Thick Liquid: Not tested   Honey Thick Honey Thick Liquid: Not tested   Puree Puree: Within functional limits   Solid Solid: Within functional limits       Roque CashLitaker, Breck CoonsLisa Willis 11/02/2015,1:21 PM  Breck CoonsLisa Willis JenksLitaker M.Ed [employer]-SLP Pager [phone]

## 2015-11-02 NOTE — Progress Notes (Signed)
Family Medicine Teaching Service paged regarding patient's request of Ibuprofen to help with leg pain,

## 2015-11-02 NOTE — Care Management Note (Addendum)
Case Management Note  Patient Details  Name: Lysbeth PennerMary Faulk MRN: [id number] Date of Birth: 08-28-1927  Subjective/Objective:   Date: 11/02/15 Spoke with patient at the bedside.  Introduced self as Sports coachcase manager and explained role in discharge planning and how to be reached.  [place] [facility], rep states [facility] before she came to hospital, NCM contacted Corrie DandyMary with [facility] to check on. [facility]. Expressed potential need for 3 n 1, but [facility] 's can not have this at their facility, the restroom is close enough to their bed. Verified [facility] and will have  part-time supervision at this time to best of their knowledge. Patient denied needing help with their medication. NCM gave patient 30 trial savings card for Eliquis.  Verified patient has PCP Leanor RubensteinHaber.   Plan: CM will continue to follow for discharge planning and [facility].                  Action/Plan:   Expected Discharge Date:                  Expected Discharge Plan:  Assisted Living / Rest Home  In-House Referral:  Clinical Social Work  Discharge planning Services  CM Consult  Post Acute Care Choice:    Choice offered to:     DME Arranged:    DME Agency:     HH Arranged:    HH Agency:     Status of Service:  In process, will continue to follow  Medicare Important Message Given:    Date Medicare IM Given:    Medicare IM give by:    Date Additional Medicare IM Given:    Additional Medicare Important Message give by:     If discussed at Long Length of Stay Meetings, dates discussed:    Additional Comments:  Leone Havenaylor, Abdishakur Gottschall Clinton, RN 11/02/2015, 4:06 PM

## 2015-11-02 NOTE — Progress Notes (Signed)
ANTICOAGULATION CONSULT NOTE - Follow Up Consult  Pharmacy Consult for heparin Indication: pulmonary embolus  Labs:  Recent Labs  11/01/15 1755 11/01/15 2121 11/02/15 0234  HGB 11.8*  --  11.9*  HCT 36.0  --  37.7  PLT 95*  --  134*  HEPARINUNFRC  --   --  0.20*  CREATININE 0.62  --   --   TROPONINI  --  <0.03  --     Assessment: 81yo female subtherapeutic on heparin with initial dosing for PE.  Goal of Therapy:  Heparin level 0.3-0.7 units/ml   Plan:  Will rebolus with heparin 1000 units x1 and increase gtt by 3 units/kg/hr to 750 units/hr and check level in 8hr.  Vernard GamblesVeronda Demontray Franta, PharmD, BCPS  11/02/2015,2:59 AM

## 2015-11-02 NOTE — Progress Notes (Signed)
ANTIBIOTIC CONSULT NOTE - INITIAL  Pharmacy Consult for Vancocin and Zosyn Indication: pneumonia  No Known Allergies  Patient Measurements: Height: 5' (152.4 cm) Weight: 110 lb (49.896 kg) IBW/kg (Calculated) : 45.5  Vital Signs: Temp: 99.4 F (37.4 C) (12/05 2051) Temp Source: Oral (12/05 2051) BP: 138/61 mmHg (12/05 2051) Pulse Rate: 97 (12/05 2051)  Labs:  Recent Labs  11/01/15 1755  WBC 3.8*  HGB 11.8*  PLT 95*  CREATININE 0.62   Estimated Creatinine Clearance: 36.3 mL/min (by C-G formula based on Cr of 0.62).  Medical History: Past Medical History  Diagnosis Date  . Pacemaker   . A-fib (HCC)   . Hypertension     Medications:  Prescriptions prior to admission  Medication Sig Dispense Refill Last Dose  . ALPRAZolam (XANAX) 0.25 MG tablet Take 0.25 mg by mouth at bedtime as needed for sleep.   PRN  . cholecalciferol (VITAMIN D) 1000 UNITS tablet Take 1,000 Units by mouth daily.   11/01/2015 at Unknown time  . guaiFENesin (MUCINEX) 600 MG 12 hr tablet Take 600 mg by mouth 2 (two) times daily.   11/01/2015 at Unknown time  . ibuprofen (ADVIL,MOTRIN) 200 MG tablet Take 200 mg by mouth every 4 (four) hours as needed.   11/01/2015 at Unknown time  . LORazepam (ATIVAN) 0.5 MG tablet Take 0.5 mg by mouth every 8 (eight) hours as needed for anxiety.    PRN  . magnesium hydroxide (MILK OF MAGNESIA) 400 MG/5ML suspension Take 60 mLs by mouth daily as needed for mild constipation.   PRN  . Melatonin 5 MG TABS Take 5 mg by mouth at bedtime.   10/29/2015  . metoprolol tartrate (LOPRESSOR) 25 MG tablet Take 12.5 mg by mouth 2 (two) times daily.    11/01/2015 at 1000 am  . mirtazapine (REMERON) 7.5 MG tablet Take 3.75 mg by mouth at bedtime.   10/31/2015 at Unknown time  . omeprazole (PRILOSEC) 20 MG capsule Take 20 mg by mouth daily.   11/01/2015 at Unknown time  . ondansetron (ZOFRAN) 4 MG tablet Take 4 mg by mouth every 8 (eight) hours as needed for nausea or vomiting.   PRN  .  polyethylene glycol (MIRALAX / GLYCOLAX) packet Take 17 g by mouth daily as needed for mild constipation.    PRN  . predniSONE (DELTASONE) 20 MG tablet Take 40 mg by mouth daily with breakfast. x2 days   11/01/2015 at Unknown time  . ranitidine (ZANTAC) 150 MG tablet Take 150 mg by mouth at bedtime.   10/31/2015 at Unknown time   Scheduled:  . antiseptic oral rinse  7 mL Mouth Rinse BID  . cholecalciferol  1,000 Units Oral Daily  . docusate sodium  100 mg Oral BID  . famotidine  20 mg Oral Daily  . ipratropium-albuterol  3 mL Nebulization Q4H  . Melatonin  3 mg Oral QHS  . metoprolol tartrate  12.5 mg Oral BID  . mirtazapine  3.75 mg Oral QHS  . pantoprazole  40 mg Oral Daily  . predniSONE  50 mg Oral Q breakfast  . sodium chloride  3 mL Intravenous Q12H  . sodium chloride  3 mL Intravenous Q12H   Infusions:  . heparin 600 Units/hr (11/01/15 2230)    Assessment: 81yo female admitted w/ worsening dyspnea, started on heparin for presumed PE, CT now confirms PE but also shows PNA, to begin IV ABX.  Goal of Therapy:  Vancomycin trough level 15-20 mcg/ml  Plan:  Vanc and  Zosyn had been ordered earlier but d/c'd before given; will start vancomycin  IV Q24H and Zosyn 3.375g IV Q8H and monitor CBC, Cx, levels prn.  Vernard Gambles, PharmD, BCPS  11/02/2015,1:21 AM

## 2015-11-02 NOTE — Progress Notes (Signed)
Family Medicine Teaching Service Daily Progress Note Intern Pager: [phone]  Patient name: Latisa Belay Medical record number: [id number] Date of birth: December 03, 1926 Age: 81 y.o. Gender: female  Primary Care Provider: [name], MD Consultants: None  Code Status: Full   Pt Overview and Major Events to Date:  12/5: Patient admitted for dyspnea  12/6: Found to have LLL PE on CTA   Assessment and Plan: Mahlia Fernando is a 81 y.o. female presenting with dyspnea . PMH is significant for COPD, HTN, and a.fib.  Dyspnea: Worsening. Patient afebrile and without cough. CXR showed worsening airspace disease in the left upper lobe and right lower lobes since previous exam.  Increased BNP, crackles heard on lung exam, and LE edema could reflect CHF as etiology, though patient has no history of this, she does have chronic a fib. History of unilateral LE swelling as well as recently immobility raise concern for PE. Wells Score 7.5, placing patient in high risk group. CTA showed LLL PE, as well as consolidations concerning for PNA.  - telemetry  - heparin drip started on 12/5 due to likelihood of PE >> consider transition to Elliquis for anticoagulation starting dose 10 mg BID x week and then transition to 5 mg BID  - IV Lasix 40 mg x1 with what appears to be good diuresis result  - Daily weights and I/O's: 1600 mL output (2/7 mL/kg/hr) - Continuous pulse ox, O2 prn (home is 2L); 93-97% O2 sats on 3L [place]  - Trend troponins and repeat EKG to r/o ACS: <0.03 >> 0.03, last troponin still pending  - Echo to evaluate for possible HF - Duonebs q4 scheduled, PRN albuterol - Continue prednisone burst (started by patient's PCP outpatient) - confirm start date to clarify stop date -Vancomycin and Zosyn Day 1 for possible PNA  On CTA; patient has recent history of  recurrent PNA -SLP consult given patient's recent history of 3 hospitalizations for PNA and possibility of aspiration as etiology  - Consider palliative  care consult once acute processes have been ruled out - PCP has started discussion with family  LE edema: Was primarily unilateral, prompting Doppler US at ALF two days ago. Doppler showed no signs of DVT, per PCP. Improved greatly, but 1+ edema to mid-shin with trace to knee still present. No erythema or cords noted or tenderness to palpation.  - Continue to monitor  Pancytopenia: mild, but changed from previously. Fluid overload with dilutional effect could explain partially - Continue to monitor, especially in light of heparin use. -platelets improved from 95 >>134  COPD: Patient's PCP began prednisone burst prior to admission for potential exacerbation.  - Scheduled Duonebs q4h - Cont prednisone burst - clarify with PCP when medication was started, so we will know stop date  Chronic A.fib: Not currently anticoagulated at home. Patient has pacemaker. Patient denies history of falls and history of head bleeds.  - Continue home Lopressor -CHADVASC score of 4   HTN: BP Stable on admission - Continue home Lopressor  FEN/GI: heart healthy diet, SLIV Prophylaxis: heparin drip  Disposition: return to ALF pending clinical improvement and possible palliative care goals of care discussion   Subjective:  Patient reports improvement in dyspnea and in swelling of R LE. Is comfortable with no complaints. Agreeable with anticoagulation.   Objective: Temp:  [97.5 F (36.4 C)-99.4 F (37.4 C)] 98 F (36.7 C) (12/06 0620) Pulse Rate:  [80-104] 80 (12/06 0620) Resp:  [18-33] 18 (12/06 0620) BP: (131-141)/(56-70) 131/59 mmHg (12/06 0620)  SpO2:  [93 %-97 %] 95 % (12/06 0620) Weight:  [101 lb (45.813 kg)-110 lb (49.896 kg)] 109 lb 6.4 oz (49.624 kg) (12/06 0500) Physical Exam: General: pleasant, cachectic female lying in bed in NAD  Cardiovascular: RRR. No murmurs appreciated.  Respiratory: Mild crackles in bilateral lung bases. No wheezing. Normal WOB.  Abdomen: +BS, soft, NTND   Extremities: trace-1+ edema in R LE with no edema in L LE   Laboratory:  Recent Labs Lab 11/01/15 1755 11/02/15 0234  WBC 3.8* 2.6*  HGB 11.8* 11.9*  HCT 36.0 37.7  PLT 95* 134*    Recent Labs Lab 11/01/15 1755  NA 141  K 3.3*  CL 108  CO2 23  BUN 15  CREATININE 0.62  CALCIUM 8.7*  PROT 6.2*  BILITOT 0.7  ALKPHOS 65  ALT 17  AST 22  GLUCOSE 150*   BNP 320.5  i StatTroponin 0.00  Troponin <0.03 >> <0.03  Lactic acid 1.97  EKG: Afib, but regular rate 2/2 ventricular pacing, IVCD, nonischemic, grossly unchanged from previous   Imaging/Diagnostic Tests: Dg Chest 2 View  10/15/2015  CLINICAL DATA:  Shortness of breath. EXAM: CHEST  2 VIEW COMPARISON:  None. FINDINGS: Dual lead cardiac pacemaker is seen with leads overlying the right atrium and right ventricle. Cardiomediastinal silhouette is normal. Mediastinal contours appear intact. There is no evidence of focal airspace consolidation, or pneumothorax. There are increased interstitial markings on the background of emphysematous changes. There are bilateral small pleural effusions with associated subsegmental atelectasis. There is a severe anterior compression deformity of 1 of the mid thoracic vertebral bodies with unknown acuity. Multilevel osteoarthritic changes are also seen. Soft tissues are grossly normal. IMPRESSION: Bilateral pleural effusions with bibasilar subsegmental atelectasis. Mildly increased interstitial markings on the background of emphysematous changes, which may represent an element of pulmonary edema. Anterior compression deformity of 1 of the mid thoracic vertebral bodies with unknown acuity. Electronically Signed   By: Ted Mcalpine M.D.   On: 10/15/2015 18:45   Ct Angio Chest Pe W/cm &/or Wo Cm  11/02/2015  CLINICAL DATA:  81 year old female with worsening shortness of breath EXAM: CT ANGIOGRAPHY CHEST WITH CONTRAST TECHNIQUE: Multidetector CT imaging of the chest was performed using the  standard protocol during bolus administration of intravenous contrast. Multiplanar CT image reconstructions and MIPs were obtained to evaluate the vascular anatomy. CONTRAST:  80mL OMNIPAQUE IOHEXOL 350 MG/ML SOLN COMPARISON:  Chest radiograph dated 11/01/2015 and 10/15/2015 FINDINGS: There is severe bullous emphysematous changes of the lungs. There is a 2.9 x 4.8 cm cavitary lesion in the left upper lobe with thickened wall and internal debris which may represent an infected bulla. Other infectious process are not excluded. The cavitary neoplasm is less likely. There is extension of airspace disease from the left apical region to the left hilum. A focal area of consolidation is noted at the right lung base, likely infectious in etiology. Underlying mass is not excluded. Clinical correlation and follow-up resolution is recommended. There are fibrotic changes and scarring of the left upper lobe with volume loss. There is no pleural effusion or pneumothorax. Bold The central airways are patent. There is atherosclerotic calcification of the thoracic aorta. There is dilatation of the main pulmonary trunk indicative of a degree of pulmonary hypertension. There is a small filling defect in the subsegmental left lower lobe pulmonary artery branch (series 406 image 154). Small right upper lobe pulmonary artery nonocclusive/ peripheral filling defect may represent chronic thrombus/scarring. There is no cardiomegaly or  pericardial effusion. There is coronary vascular calcification. Top-normal bilateral hilar lymph nodes. No mediastinal adenopathy. The thyroid gland is not well visualized. The esophagus is grossly unremarkable. There is no axillary adenopathy. Left pectoral pacemaker device. There is diffuse subcutaneous soft tissue stranding of the chest wall. There is extensive degenerative changes of the spine no acute fracture. Cholecystectomy. The visualized upper abdomen is grossly unremarkable. Review of the MIP images  confirms the above findings. IMPRESSION: Small left lower lobe subsegmental pulmonary artery branch embolus. Severe emphysema with left apical cavitary opacity, likely a bulla with superimposed pneumonia. Right lung base focal consolidation most compatible with pneumonia. Clinical correlation and follow-up recommended. Dilated main pulmonary trunk compatible with a degree of pulmonary hypertension. Critical Value/emergent results were called by telephone at the time of interpretation on 11/02/2015 at 12:41 am to nurse Le who verbally acknowledged these results. Electronically Signed   By: Elgie CollardArash  Radparvar M.D.   On: 11/02/2015 00:42   Dg Chest Port 1 View  11/01/2015  CLINICAL DATA:  Chest and back pain x 1 week w/ sob. pna in Sept 2016. Bronchitis 2 wks. No fever. No n/v. Htn. No diab. Ex smoker. No hx mi or stroke. No hx tb. No jaw, neck, or arm pain. EXAM: PORTABLE CHEST - 1 VIEW COMPARISON:  10/15/2015 FINDINGS: Progressive airspace consolidation in the left upper lobe peripherally. Some increase in airspace opacities at the right lung base. Heart size normal. Atheromatous aorta. Left subclavian pacemaker stable. Persistent blunting of lateral costophrenic angles as before. No pneumothorax. Visualized skeletal structures are unremarkable. IMPRESSION: 1. Worsening airspace disease in the left upper lobe and right lower lobes since previous exam. Electronically Signed   By: Corlis Leak  Hassell M.D.   On: 11/01/2015 17:03    Arvilla Marketatherine Lauren Tiny Chaudhary, DO 11/02/2015, 7:18 AM PGY-1, [facility] FPTS Intern pager: [phone], text pages welcome

## 2015-11-02 NOTE — Progress Notes (Signed)
Echocardiogram 2D Echocardiogram has been performed.  Brooke Curry, Sherill Mangen M 11/02/2015, 12:28 PM

## 2015-11-02 NOTE — Discharge Summary (Signed)
Family Medicine Teaching [facility] Discharge Summary  Patient name: Brooke Curry Medical record number: [id number] Date of birth: [birth year] Age: 81 y.o. Gender: female Date of Admission: 11/01/2015  Date of Discharge: 11/04/2015 Admitting Physician: Moses Manners, MD  Primary Care Provider: [name], MD Consultants: None   Indication for Hospitalization: Pulmonary Embolism   Discharge Diagnoses/Problem List:  Patient Active Problem List   Diagnosis Date Noted  . HAP (hospital-acquired pneumonia)   . Pulmonary embolism without acute cor pulmonale (HCC)   . Dyspnea 11/01/2015  . Essential hypertension 08/26/2015  . Sick sinus syndrome (HCC) 08/26/2015  . Atrial fibrillation (HCC) 08/26/2015    Disposition: ALF  Discharge Condition: Improved   Discharge Exam:  General: pleasant, cachectic female lying in bed in NAD  Cardiovascular: RRR. No murmurs appreciated.  Respiratory: CTAB. Normal WOB.  Abdomen: +BS, soft, NTND  Extremities: trace edema in R LE with no edema in L LE   Brief Hospital Course:  Brooke Curry is 81 y.o. female with PMH significant for COPD, HTN and A Fib who presented with worsening dyspnea. Wells Score was calculated to be 7.5 and CTA of the chest was ordered, revealing a left lower lobe pulmonary embolism and an area of consolidation concerning for PNA. Two days prior to admission patient had noticed swelling in her right lower leg. A doppler ultrasound was performed at her ALF, which was negative for a DVT. Patient was started on heparin drip prior to CTA results given high suspicion of PE. After confirmation, she was transitioned from hepatin drip to Eliquis. Anticoagulation will also be beneficial given her history of A Fib with a CHADSVASC score of 4.   Patient has history of being hospitalized and treated for PNA 3x in the past 6 months. Given concern for PNA on CTA, she was started on vancomycin and zosyn. Discussed this with her PCP Leanor Rubenstein)  who mentioned that imaging has shown the saw type of scaring or fluid present in the same location chronically. She felt that a PNA was unlikely and desired antibiotics to be discontinued. Given that patient had been afebrile since admission, was without leukocytosis, appeared non-toxic, and was not hypoxic on home O2 inpatient team felt comfortable discontinuing antibiotics.Patient finished a prednisone course that had been started by her PCP prior to admission on day of discharge.   Arrangement was made to send patient to SNF for 24 hour assistance and PT/OT rehab. Patient was discharged on home O2.   Issues for Follow Up:  1. Started on Eliquis during hospitalization for PE and Afib with CHADSVAC score of 4. Initial dose of 10 mg BID x 1 week, then transition to 5 mg BID. Consider decreasing further to 2.5 mg BID after 6 months due to patient's low body weight and age > 89.  2. Aspiration Precautions from SLP:  Diet recommendations: Regular;Thin liquid Liquids provided via: Cup;No straw Medication Administration: Whole meds with liquid Supervision: Patient able to self feed;Intermittent supervision to cue for compensatory strategies Compensations: Slow rate;Small sips/bites;Multiple dry swallows after each bite/sip;Clear throat intermittently Postural Changes and/or Swallow Maneuvers: Seated upright 90 degrees;Upright 30-60 min after meal 3. PT recommended rolling walker with 5" wheels for stability with mobility    Significant Procedures: None   Significant Labs and Imaging:   Recent Labs Lab 11/02/15 0234 11/03/15 0708 11/04/15 0722  WBC 2.6* 5.4 4.9  HGB 11.9* 10.8* 11.3*  HCT 37.7 33.7* 36.5  PLT 134* 128* 146*    Recent Labs Lab 11/01/15  1755 11/02/15 0840 11/03/15 1016 11/04/15 0722  NA 141 138 145 141  K 3.3* 3.1* 3.1* 4.1  CL 108 101 106 107  CO2 23 29 28 27   GLUCOSE 150* 172* 75 79  BUN 15 15 22* 24*  CREATININE 0.62 0.76 0.81 0.65  CALCIUM 8.7* 8.8* 9.4 8.9   ALKPHOS 65  --   --   --   AST 22  --   --   --   ALT 17  --   --   --   ALBUMIN 2.9*  --   --   --     Dg Chest 2 View  10/15/2015  CLINICAL DATA:  Shortness of breath. EXAM: CHEST  2 VIEW COMPARISON:  None. FINDINGS: Dual lead cardiac pacemaker is seen with leads overlying the right atrium and right ventricle. Cardiomediastinal silhouette is normal. Mediastinal contours appear intact. There is no evidence of focal airspace consolidation, or pneumothorax. There are increased interstitial markings on the background of emphysematous changes. There are bilateral small pleural effusions with associated subsegmental atelectasis. There is a severe anterior compression deformity of 1 of the mid thoracic vertebral bodies with unknown acuity. Multilevel osteoarthritic changes are also seen. Soft tissues are grossly normal. IMPRESSION: Bilateral pleural effusions with bibasilar subsegmental atelectasis. Mildly increased interstitial markings on the background of emphysematous changes, which may represent an element of pulmonary edema. Anterior compression deformity of 1 of the mid thoracic vertebral bodies with unknown acuity. Electronically Signed   By: Ted Mcalpineobrinka  Dimitrova M.D.   On: 10/15/2015 18:45   Ct Angio Chest Pe W/cm &/or Wo Cm  11/02/2015  CLINICAL DATA:  81 year old female with worsening shortness of breath EXAM: CT ANGIOGRAPHY CHEST WITH CONTRAST TECHNIQUE: Multidetector CT imaging of the chest was performed using the standard protocol during bolus administration of intravenous contrast. Multiplanar CT image reconstructions and MIPs were obtained to evaluate the vascular anatomy. CONTRAST:  80mL OMNIPAQUE IOHEXOL 350 MG/ML SOLN COMPARISON:  Chest radiograph dated 11/01/2015 and 10/15/2015 FINDINGS: There is severe bullous emphysematous changes of the lungs. There is a 2.9 x 4.8 cm cavitary lesion in the left upper lobe with thickened wall and internal debris which may represent an infected bulla.  Other infectious process are not excluded. The cavitary neoplasm is less likely. There is extension of airspace disease from the left apical region to the left hilum. A focal area of consolidation is noted at the right lung base, likely infectious in etiology. Underlying mass is not excluded. Clinical correlation and follow-up resolution is recommended. There are fibrotic changes and scarring of the left upper lobe with volume loss. There is no pleural effusion or pneumothorax. Bold The central airways are patent. There is atherosclerotic calcification of the thoracic aorta. There is dilatation of the main pulmonary trunk indicative of a degree of pulmonary hypertension. There is a small filling defect in the subsegmental left lower lobe pulmonary artery branch (series 406 image 154). Small right upper lobe pulmonary artery nonocclusive/ peripheral filling defect may represent chronic thrombus/scarring. There is no cardiomegaly or pericardial effusion. There is coronary vascular calcification. Top-normal bilateral hilar lymph nodes. No mediastinal adenopathy. The thyroid gland is not well visualized. The esophagus is grossly unremarkable. There is no axillary adenopathy. Left pectoral pacemaker device. There is diffuse subcutaneous soft tissue stranding of the chest wall. There is extensive degenerative changes of the spine no acute fracture. Cholecystectomy. The visualized upper abdomen is grossly unremarkable. Review of the MIP images confirms the above findings. IMPRESSION: Small  left lower lobe subsegmental pulmonary artery branch embolus. Severe emphysema with left apical cavitary opacity, likely a bulla with superimposed pneumonia. Right lung base focal consolidation most compatible with pneumonia. Clinical correlation and follow-up recommended. Dilated main pulmonary trunk compatible with a degree of pulmonary hypertension. Critical Value/emergent results were called by telephone at the time of interpretation  on 11/02/2015 at 12:41 am to nurse Le who verbally acknowledged these results. Electronically Signed   By: Elgie Collard M.D.   On: 11/02/2015 00:42   Dg Chest Port 1 View  11/01/2015  CLINICAL DATA:  Chest and back pain x 1 week w/ sob. pna in Sept 2016. Bronchitis 2 wks. No fever. No n/v. Htn. No diab. Ex smoker. No hx mi or stroke. No hx tb. No jaw, neck, or arm pain. EXAM: PORTABLE CHEST - 1 VIEW COMPARISON:  10/15/2015 FINDINGS: Progressive airspace consolidation in the left upper lobe peripherally. Some increase in airspace opacities at the right lung base. Heart size normal. Atheromatous aorta. Left subclavian pacemaker stable. Persistent blunting of lateral costophrenic angles as before. No pneumothorax. Visualized skeletal structures are unremarkable. IMPRESSION: 1. Worsening airspace disease in the left upper lobe and right lower lobes since previous exam. Electronically Signed   By: Corlis Leak M.D.   On: 11/01/2015 17:03    Results/Tests Pending at Time of Discharge: None   Discharge Medications:    Medication List    ASK your doctor about these medications        ALPRAZolam 0.25 MG tablet  Commonly known as:  XANAX  Take 0.25 mg by mouth at bedtime as needed for sleep.     cholecalciferol 1000 UNITS tablet  Commonly known as:  VITAMIN D  Take 1,000 Units by mouth daily.     guaiFENesin 600 MG 12 hr tablet  Commonly known as:  MUCINEX  Take 600 mg by mouth 2 (two) times daily.     ibuprofen 200 MG tablet  Commonly known as:  ADVIL,MOTRIN  Take 200 mg by mouth every 4 (four) hours as needed.     LORazepam 0.5 MG tablet  Commonly known as:  ATIVAN  Take 0.5 mg by mouth every 8 (eight) hours as needed for anxiety.     magnesium hydroxide 400 MG/5ML suspension  Commonly known as:  MILK OF MAGNESIA  Take 60 mLs by mouth daily as needed for mild constipation.     Melatonin 5 MG Tabs  Take 5 mg by mouth at bedtime.     metoprolol tartrate 25 MG tablet  Commonly known  as:  LOPRESSOR  Take 12.5 mg by mouth 2 (two) times daily.     mirtazapine 7.5 MG tablet  Commonly known as:  REMERON  Take 3.75 mg by mouth at bedtime.     omeprazole 20 MG capsule  Commonly known as:  PRILOSEC  Take 20 mg by mouth daily.     ondansetron 4 MG tablet  Commonly known as:  ZOFRAN  Take 4 mg by mouth every 8 (eight) hours as needed for nausea or vomiting.     polyethylene glycol packet  Commonly known as:  MIRALAX / GLYCOLAX  Take 17 g by mouth daily as needed for mild constipation.     predniSONE 20 MG tablet  Commonly known as:  DELTASONE  Take 40 mg by mouth daily with breakfast. x2 days     ranitidine 150 MG tablet  Commonly known as:  ZANTAC  Take 150 mg by mouth at bedtime.  Discharge Instructions: Please refer to Patient Instructions section of EMR for full details.  Patient was counseled important signs and symptoms that should prompt return to medical care, changes in medications, dietary instructions, activity restrictions, and follow up appointments.   Follow-Up Appointments: Follow-up Information    Follow up with [facility].   Why:  Resume HHPT, HHOt   Contact information:   [address] [place] [address] [phone]       Follow up with [name], MD. Schedule an appointment as soon as possible for a visit in 1 week.   Specialties:  Internal Medicine, Geriatric Medicine   Why:  For Hospital Followup   Contact information:   [address] [place] [address] [phone]       Arvilla Market, DO 11/04/2015, 12:19 PM PGY-1, [facility]

## 2015-11-03 ENCOUNTER — Inpatient Hospital Stay (HOSPITAL_COMMUNITY): Payer: Medicare Other

## 2015-11-03 DIAGNOSIS — I10 Essential (primary) hypertension: Secondary | ICD-10-CM

## 2015-11-03 LAB — BASIC METABOLIC PANEL
Anion gap: 11 (ref 5–15)
BUN: 22 mg/dL — AB (ref 6–20)
CHLORIDE: 106 mmol/L (ref 101–111)
CO2: 28 mmol/L (ref 22–32)
CREATININE: 0.81 mg/dL (ref 0.44–1.00)
Calcium: 9.4 mg/dL (ref 8.9–10.3)
GFR calc Af Amer: >60 mL/min (ref >60)
GFR calc non Af Amer: >60 mL/min (ref >60)
GLUCOSE: 75 mg/dL (ref 65–99)
Potassium: 3.1 mmol/L — ABNORMAL LOW (ref 3.5–5.1)
SODIUM: 145 mmol/L (ref 135–145)

## 2015-11-03 LAB — CBC
HCT: 33.7 % — ABNORMAL LOW (ref 36.0–46.0)
Hemoglobin: 10.8 g/dL — ABNORMAL LOW (ref 12.0–15.0)
MCH: 30.9 pg (ref 26.0–34.0)
MCHC: 32 g/dL (ref 30.0–36.0)
MCV: 96.6 fL (ref 78.0–100.0)
PLATELETS: 128 10*3/uL — AB (ref 150–400)
RBC: 3.49 MIL/uL — ABNORMAL LOW (ref 3.87–5.11)
RDW: 15.9 % — AB (ref 11.5–15.5)
WBC: 5.4 10*3/uL (ref 4.0–10.5)

## 2015-11-03 MED ORDER — POTASSIUM CHLORIDE CRYS ER 20 MEQ PO TBCR: 40.0000 meq | EXTENDED_RELEASE_TABLET | Freq: Two times a day (BID) | ORAL | Status: DC

## 2015-11-03 MED ORDER — POTASSIUM CHLORIDE CRYS ER 20 MEQ PO TBCR
40.0000 meq | EXTENDED_RELEASE_TABLET | Freq: Two times a day (BID) | ORAL | Status: AC
  Administered 2015-11-03 (×2): 40 meq via ORAL
  Filled 2015-11-03 (×3): qty 2

## 2015-11-03 MED ORDER — DIPHENHYDRAMINE HCL 25 MG PO CAPS
25.0000 mg | ORAL_CAPSULE | Freq: Once | ORAL | Status: AC
  Administered 2015-11-03: 25 mg via ORAL
  Filled 2015-11-03: qty 1

## 2015-11-03 NOTE — Progress Notes (Signed)
Family Medicine Teaching Service Daily Progress Note Intern Pager: [phone]  Patient name: Brooke Curry Medical record number: [id number] Date of birth: [birth year] Age: 81 y.o. Gender: female  Primary Care Provider: [name], MD Consultants: None  Code Status: Full   Pt Overview and Major Events to Date:  12/5: Patient admitted for dyspnea  12/6: Found to have LLL PE on CTA   Assessment and Plan: Brooke Curry is a 81 y.o. female presenting with dyspnea . PMH is significant for COPD, HTN, and a.fib.  Dyspnea:  CXR showed worsening airspace disease in the left upper lobe and right lower lobes since previous exam.  CTA showed LLL PE, as well as consolidations concerning for PNA. Troponins all <0.03 and EKG  - telemetry  - heparin drip started on 12/5 due to likelihood of PE >>  transitioned to Elliquis on 12/6 for anticoagulation starting dose 10 mg BID x week and then transition to 5 mg BID  - Daily weights and I/O's: 825 mL output (0.8 mL/kg/hr) - Continuous pulse ox, O2 prn (home is 2L); 95-99% O2 sats on 2-3L [place]  - Echo to evaluate for possible HF: LVEF 55-60% - Duonebs q4 scheduled, PRN albuterol - Continue prednisone burst, started on 12/5; d/c on 12/9 -Vancomycin and Zosyn Day 2 for possible PNA  On CTA; patient has recent history of  recurrent PNA -SLP consult given patient's recent history of 3 hospitalizations for PNA and possibility of aspiration as etiology  - Consider palliative care consult once acute processes have been ruled out - PCP has started discussion with family  Hypokalemia: K 3.1 (12/6). Received 40 mEq of Kdur yesterday. -recheck BMET and replete K if necessary   Pancytopenia: mild, but changed from previously. Fluid overload with dilutional effect could explain partially - Continue to monitor, especially in light of heparin use. -platelets improved from 95 >>134>>128  COPD: Patient's PCP began prednisone burst prior to admission for potential  exacerbation.  - Scheduled Duonebs q4h - Cont prednisone burst to end on 12/9  Chronic A.fib:  Patient has pacemaker. Patient denies history of falls and history of head bleeds.  - Continue home Lopressor -CHADVASC score of 4  -started Eliquis on 12/6  HTN: BP Stable - Continue home Lopressor  FEN/GI: heart healthy diet, SLIV Prophylaxis: Eliquis 10 mg BID   Disposition: return to ALF pending clinical improvement and possible palliative care goals of care discussion   Subjective:  Patient reports continued improvement in her dyspnea.   Objective: Temp:  [97.6 F (36.4 C)-98.2 F (36.8 C)] 97.6 F (36.4 C) (12/07 0449) Pulse Rate:  [80-97] 80 (12/07 0449) Resp:  [16-20] 20 (12/07 0449) BP: (124-157)/(57-73) 157/73 mmHg (12/07 0449) SpO2:  [95 %-97 %] 95 % (12/07 0449) Weight:  [100 lb 8 oz (45.587 kg)] 100 lb 8 oz (45.587 kg) (12/07 0449) Physical Exam: General: pleasant, cachectic female lying in bed in NAD  Cardiovascular: RRR. No murmurs appreciated.  Respiratory: CTAB. Normal WOB.  Abdomen: +BS, soft, NTND  Extremities: trace edema in R LE with no edema in L LE   Laboratory:  Recent Labs Lab 11/01/15 1755 11/02/15 0234  WBC 3.8* 2.6*  HGB 11.8* 11.9*  HCT 36.0 37.7  PLT 95* 134*    Recent Labs Lab 11/01/15 1755 11/02/15 0840  NA 141 138  K 3.3* 3.1*  CL 108 101  CO2 23 29  BUN 15 15  CREATININE 0.62 0.76  CALCIUM 8.7* 8.8*  PROT 6.2*  --  BILITOT 0.7  --   ALKPHOS 65  --   ALT 17  --   AST 22  --   GLUCOSE 150* 172*   BNP 320.5  i StatTroponin 0.00  Troponin <0.03 >> <0.03  Lactic acid 1.97  EKG: Afib, but regular rate 2/2 ventricular pacing, IVCD, nonischemic, grossly unchanged from previous   Imaging/Diagnostic Tests: Dg Chest 2 View  10/15/2015  CLINICAL DATA:  Shortness of breath. EXAM: CHEST  2 VIEW COMPARISON:  None. FINDINGS: Dual lead cardiac pacemaker is seen with leads overlying the right atrium and right ventricle.  Cardiomediastinal silhouette is normal. Mediastinal contours appear intact. There is no evidence of focal airspace consolidation, or pneumothorax. There are increased interstitial markings on the background of emphysematous changes. There are bilateral small pleural effusions with associated subsegmental atelectasis. There is a severe anterior compression deformity of 1 of the mid thoracic vertebral bodies with unknown acuity. Multilevel osteoarthritic changes are also seen. Soft tissues are grossly normal. IMPRESSION: Bilateral pleural effusions with bibasilar subsegmental atelectasis. Mildly increased interstitial markings on the background of emphysematous changes, which may represent an element of pulmonary edema. Anterior compression deformity of 1 of the mid thoracic vertebral bodies with unknown acuity. Electronically Signed   By: Ted Mcalpine M.D.   On: 10/15/2015 18:45   Ct Angio Chest Pe W/cm &/or Wo Cm  11/02/2015  CLINICAL DATA:  81 year old female with worsening shortness of breath EXAM: CT ANGIOGRAPHY CHEST WITH CONTRAST TECHNIQUE: Multidetector CT imaging of the chest was performed using the standard protocol during bolus administration of intravenous contrast. Multiplanar CT image reconstructions and MIPs were obtained to evaluate the vascular anatomy. CONTRAST:  80mL OMNIPAQUE IOHEXOL 350 MG/ML SOLN COMPARISON:  Chest radiograph dated 11/01/2015 and 10/15/2015 FINDINGS: There is severe bullous emphysematous changes of the lungs. There is a 2.9 x 4.8 cm cavitary lesion in the left upper lobe with thickened wall and internal debris which may represent an infected bulla. Other infectious process are not excluded. The cavitary neoplasm is less likely. There is extension of airspace disease from the left apical region to the left hilum. A focal area of consolidation is noted at the right lung base, likely infectious in etiology. Underlying mass is not excluded. Clinical correlation and  follow-up resolution is recommended. There are fibrotic changes and scarring of the left upper lobe with volume loss. There is no pleural effusion or pneumothorax. Bold The central airways are patent. There is atherosclerotic calcification of the thoracic aorta. There is dilatation of the main pulmonary trunk indicative of a degree of pulmonary hypertension. There is a small filling defect in the subsegmental left lower lobe pulmonary artery branch (series 406 image 154). Small right upper lobe pulmonary artery nonocclusive/ peripheral filling defect may represent chronic thrombus/scarring. There is no cardiomegaly or pericardial effusion. There is coronary vascular calcification. Top-normal bilateral hilar lymph nodes. No mediastinal adenopathy. The thyroid gland is not well visualized. The esophagus is grossly unremarkable. There is no axillary adenopathy. Left pectoral pacemaker device. There is diffuse subcutaneous soft tissue stranding of the chest wall. There is extensive degenerative changes of the spine no acute fracture. Cholecystectomy. The visualized upper abdomen is grossly unremarkable. Review of the MIP images confirms the above findings. IMPRESSION: Small left lower lobe subsegmental pulmonary artery branch embolus. Severe emphysema with left apical cavitary opacity, likely a bulla with superimposed pneumonia. Right lung base focal consolidation most compatible with pneumonia. Clinical correlation and follow-up recommended. Dilated main pulmonary trunk compatible with a degree  of pulmonary hypertension. Critical Value/emergent results were called by telephone at the time of interpretation on 11/02/2015 at 12:41 am to nurse Le who verbally acknowledged these results. Electronically Signed   By: Elgie CollardArash  Radparvar M.D.   On: 11/02/2015 00:42   Dg Chest Port 1 View  11/01/2015  CLINICAL DATA:  Chest and back pain x 1 week w/ sob. pna in Sept 2016. Bronchitis 2 wks. No fever. No n/v. Htn. No diab. Ex  smoker. No hx mi or stroke. No hx tb. No jaw, neck, or arm pain. EXAM: PORTABLE CHEST - 1 VIEW COMPARISON:  10/15/2015 FINDINGS: Progressive airspace consolidation in the left upper lobe peripherally. Some increase in airspace opacities at the right lung base. Heart size normal. Atheromatous aorta. Left subclavian pacemaker stable. Persistent blunting of lateral costophrenic angles as before. No pneumothorax. Visualized skeletal structures are unremarkable. IMPRESSION: 1. Worsening airspace disease in the left upper lobe and right lower lobes since previous exam. Electronically Signed   By: Corlis Leak  Hassell M.D.   On: 11/01/2015 17:03    Arvilla Marketatherine Lauren Wallace, DO 11/03/2015, 7:23 AM PGY-1, [facility] FPTS Intern pager: [phone], text pages welcome

## 2015-11-03 NOTE — Progress Notes (Addendum)
Speech Language Pathology Treatment:   MBS    SEE FULL REPORT IN IMAGING SECTION. DOUBLE CLICK ON DG SWALLOW FUNCTION. Patient Details Name: Brooke Curry MRN: [id number] DOB: 19-Mar-1927 Today's Date: 11/03/2015 Time: [id number] SLP Time Calculation (min) (ACUTE ONLY): 20 min    CHL IP CLINICAL IMPRESSIONS 11/03/2015  Therapy Diagnosis Mild pharyngeal phase dysphagia  Clinical Impression Oral manipulation and transit were appropriate with most consistencies. Required applesauce to orally transit barium pill. Thin barium was penetrated (flash) with cup and remained in laryngeal vestibule with straw sip this due to delayed swallow initiation without sensation. Therapeutic techniques were attempted (chin tuck and supraglottic swallow) to prevent flash penetration with cup sips ineffectively. Small, single cup sips of thin were tolerated without entering airway. Minimal pyriform sinus residue present due to decreased laryngeal elevation. MBS does not diagnose below level of the UES however brief view of esophagus revealed mild-moderate stasis mid esophagus with pill stopping at the GE junction needing thin barium to propel into stomach. Aspiration after the swallow from esophageal residue is possible. Recommend regular texture and thin liquids, no straws, pills whole in applesauce, stay upright minimum 45 minutes after meal, swallow twice and throat clear/cough periodially.   Impact on safety and function Moderate aspiration risk     Royce MacadamiaLisa Willis Damaso Laday M.Ed [employer]SLP Pager [phone]

## 2015-11-03 NOTE — Evaluation (Signed)
Physical Therapy Evaluation Patient Details Name: Brooke Curry MRN: [id number] DOB: 1927/05/29 Today's Date: 11/03/2015   History of Present Illness  this 81 y.o. female admitted with worsening dyspnea.  CXR revealed worsening airspace disease Lt upper lobe and Rt lower lobes.  CTA showed LLL PE as well as consolidations concerning for PNA.  PMH includes COPD, HTN, A-fib  Clinical Impression  Pt presents with above.  Note pt with increased DOE during short distance gait around bed to recliner on 3LO2.  She was able to maintain sats in 90s throughout but did drop from 99% pre gait to 93% post gait.  Agree with OT in that if facility can provide higher level of care and 24/7 S/assist, she could return to ALF, if not will need ST SNF placement to increase functional independence.  Will continue to see acutely to address deficits.      Follow Up Recommendations SNF;Supervision/Assistance - 24 hour    Equipment Recommendations  Rolling walker with 5" wheels    Recommendations for Other Services       Precautions / Restrictions Precautions Precautions: Fall Restrictions Weight Bearing Restrictions: No      Mobility  Bed Mobility Overal bed mobility: Needs Assistance Bed Mobility: Supine to Sit     Supine to sit: Supervision     General bed mobility comments: S for safety with HOB elevated and use of bed rails to self assist to EOB.   Transfers Overall transfer level: Needs assistance Equipment used: Rolling walker (2 wheeled) Transfers: Sit to/from Stand Sit to Stand: Min guard;Min assist         General transfer comment: Min/guard to min A to facilitate forward weight shift.  Cues for hand placement on bed.    Ambulation/Gait Ambulation/Gait assistance: Min assist Ambulation Distance (Feet): 18 Feet Assistive device: Rolling walker (2 wheeled) Gait Pattern/deviations: Step-through pattern;Decreased stride length;Trunk flexed;Narrow base of support     General Gait  Details: Pt with slow gait speed and only agreeable to ambulate in room around bed to recliner during PT session.  Pt with 2/4 DOE during gait on 3LO2 throughout.  SaO2 prior to gait was 99%, HR 78, following gait around bed was 93% and HR to 81, again all on 3LO2.   Stairs            Wheelchair Mobility    Modified Rankin (Stroke Patients Only)       Balance Overall balance assessment: Needs assistance Sitting-balance support: Feet supported Sitting balance-Leahy Scale: Fair     Standing balance support: During functional activity;Bilateral upper extremity supported Standing balance-Leahy Scale: Poor                               Pertinent Vitals/Pain Pain Assessment: Faces Faces Pain Scale: Hurts little more Pain Location: "sore all over" Pain Descriptors / Indicators: Sore Pain Intervention(s): Monitored during session;Repositioned    Home Living Family/patient expects to be discharged to:: Assisted living               Home Equipment: Walker - 2 wheels;Shower seat Additional Comments: [facility], where she has been for just a short period of time     Prior Function Level of Independence: Needs assistance   Gait / Transfers Assistance Needed: Pt ambulated in her room only and required freqent rest breaks.  She reports she requires use of w/c to go to dining room  ADL's / Homemaking Assistance Needed: Pt reports she has assist with LB ADLs, and breakfast prep.  She reports that she has lunch and dinner in dining area  Comments: Pt has an aide available in the mornings, her daughter in law for 3-4 hours, and son for 1-3 hours/day.   Pt provided contradictory information throughout eval so uncertain of the accuracy of information      Hand Dominance   Dominant Hand: Right    Extremity/Trunk Assessment               Lower Extremity Assessment: Generalized weakness      Cervical / Trunk Assessment: Kyphotic   Communication   Communication: No difficulties  Cognition Arousal/Alertness: Awake/alert Behavior During Therapy: WFL for tasks assessed/performed Overall Cognitive Status: No family/caregiver present to determine baseline cognitive functioning                      General Comments      Exercises        Assessment/Plan    PT Assessment Patient needs continued PT services  PT Diagnosis Difficulty walking;Generalized weakness;Acute pain   PT Problem List Decreased strength;Decreased activity tolerance;Decreased balance;Decreased mobility;Decreased knowledge of use of DME;Decreased safety awareness;Cardiopulmonary status limiting activity;Pain  PT Treatment Interventions DME instruction;Gait training;Functional mobility training;Therapeutic activities;Therapeutic exercise;Balance training;Patient/family education   PT Goals (Current goals can be found in the Care Plan section) Acute Rehab PT Goals Patient Stated Goal: to get stronger  PT Goal Formulation: With patient Time For Goal Achievement: 11/10/15 Potential to Achieve Goals: Good    Frequency Min 3X/week   Barriers to discharge Decreased caregiver support      Co-evaluation               End of Session Equipment Utilized During Treatment: Oxygen Activity Tolerance: Patient limited by fatigue Patient left: in chair;with call bell/phone within reach Nurse Communication: Mobility status         Time: 0810-0830 PT Time Calculation (min) (ACUTE ONLY): 20 min   Charges:   PT Evaluation $Initial PT Evaluation Tier I: 1 Procedure     PT G CodesVista Deck 11/03/2015, 8:59 AM

## 2015-11-03 NOTE — Progress Notes (Signed)
Speech Language Pathology Treatment:    Patient Details Name: Brooke PennerMary Curry MRN: [id number] DOB: 01/11/1927 Today's Date: 11/03/2015 Time:  -     MBS completed. Full report to be documented. Intermittent, trace silent laryngeal penetration with thin liquids, appeared to have esophageal residue Recommend: continue regular diet/ thin liquids, pills whole in applesauce, swallow x 2, cough/throat clear after every 3rd bite/sip     Royce MacadamiaLisa Willis Berl Bonfanti M.Ed [employer]-SLP Pager [phone]

## 2015-11-04 LAB — CBC
HEMATOCRIT: 36.5 % (ref 36.0–46.0)
HEMOGLOBIN: 11.3 g/dL — AB (ref 12.0–15.0)
MCH: 30.3 pg (ref 26.0–34.0)
MCHC: 31 g/dL (ref 30.0–36.0)
MCV: 97.9 fL (ref 78.0–100.0)
Platelets: 146 10*3/uL — ABNORMAL LOW (ref 150–400)
RBC: 3.73 MIL/uL — ABNORMAL LOW (ref 3.87–5.11)
RDW: 16.1 % — AB (ref 11.5–15.5)
WBC: 4.9 10*3/uL (ref 4.0–10.5)

## 2015-11-04 LAB — BASIC METABOLIC PANEL
ANION GAP: 7 (ref 5–15)
BUN: 24 mg/dL — ABNORMAL HIGH (ref 6–20)
CALCIUM: 8.9 mg/dL (ref 8.9–10.3)
CHLORIDE: 107 mmol/L (ref 101–111)
CO2: 27 mmol/L (ref 22–32)
Creatinine, Ser: 0.65 mg/dL (ref 0.44–1.00)
GFR calc non Af Amer: >60 mL/min (ref >60)
GLUCOSE: 79 mg/dL (ref 65–99)
POTASSIUM: 4.1 mmol/L (ref 3.5–5.1)
Sodium: 141 mmol/L (ref 135–145)

## 2015-11-04 MED ORDER — DIPHENHYDRAMINE HCL 25 MG PO CAPS
25.0000 mg | ORAL_CAPSULE | Freq: Every evening | ORAL | Status: DC | PRN
  Administered 2015-11-05 (×2): 25 mg via ORAL
  Filled 2015-11-04 (×2): qty 1

## 2015-11-04 MED ORDER — DIPHENHYDRAMINE HCL 25 MG PO CAPS
25.0000 mg | ORAL_CAPSULE | Freq: Once | ORAL | Status: AC
Start: 2015-11-04 — End: 2015-11-04
  Administered 2015-11-04: 25 mg via ORAL
  Filled 2015-11-04: qty 1

## 2015-11-04 MED ORDER — PREDNISONE 50 MG PO TABS: ORAL_TABLET | ORAL | Status: DC

## 2015-11-04 MED ORDER — IPRATROPIUM-ALBUTEROL 0.5-2.5 (3) MG/3ML IN SOLN
3.0000 mL | Freq: Two times a day (BID) | RESPIRATORY_TRACT | Status: DC
  Administered 2015-11-04 – 2015-11-05 (×2): 3 mL via RESPIRATORY_TRACT
  Filled 2015-11-04 (×2): qty 3

## 2015-11-04 MED ORDER — ALBUTEROL SULFATE (2.5 MG/3ML) 0.083% IN NEBU: 2.5000 mg | INHALATION_SOLUTION | RESPIRATORY_TRACT | Status: AC | PRN

## 2015-11-04 MED ORDER — APIXABAN 5 MG PO TABS: 10.0000 mg | ORAL_TABLET | Freq: Two times a day (BID) | ORAL | Status: AC

## 2015-11-04 NOTE — Progress Notes (Signed)
Notified in regards to patient having c/o itching. Benadryl 25mg  PO Once ordered.

## 2015-11-04 NOTE — Progress Notes (Signed)
Family Medicine Teaching Service Daily Progress Note Intern Pager: [phone]  Patient name: Brooke Curry Medical record number: [id number] Date of birth: [birth year] Age: 81 y.o. Gender: female  Primary Care Provider: [name], MD Consultants: None  Code Status: Full   Pt Overview and Major Events to Date:  12/5: Patient admitted for dyspnea  12/6: Found to have LLL PE on CTA   Assessment and Plan: Brooke Curry is a 81 y.o. female presenting with dyspnea . PMH is significant for COPD, HTN, and a.fib.  Dyspnea:  CXR showed worsening airspace disease in the left upper lobe and right lower lobes since previous exam.  CTA showed LLL PE, as well as consolidations concerning for PNA. Troponins all <0.03 and EKG not concerning for ischemia or infarction.  - telemetry  - heparin drip started on 12/5 due to likelihood of PE >>  transitioned to [place]is on 12/6 for anticoagulation starting dose 10 mg BID x week and then transition to 5 mg BID  - Continuous pulse ox, O2 prn (home is 2L); 95-99% O2 sats on 2-3L [place]  - Echo to evaluate for possible HF: LVEF 55-60% - Duonebs q4 scheduled, PRN albuterol - Continue prednisone burst, started on 12/5; d/c on 12/9 -SLP consult, appreciate recs  Hypokalemia, Resolved: K 4.1 (12/8). S/p 120 mEq of Kdur.  -recheck BMET and replete K if necessary   Pancytopenia: mild, but changed from previously. Fluid overload with dilutional effect could explain partially - Continue to monitor, especially in light of heparin use. -platelets improved from 95 >>134>>128  COPD: Patient's PCP began prednisone burst prior to admission for potential exacerbation.  - Scheduled Duonebs q4h - Cont prednisone burst to end on 12/9  Chronic A.fib:  Patient has pacemaker. Patient denies history of falls and history of head bleeds.  - Continue home Lopressor -CHADVASC score of 4  -started Eliquis on 12/6  HTN: BP Stable - Continue home Lopressor  FEN/GI: heart healthy  diet, SLIV Prophylaxis: Eliquis 10 mg BID   Disposition: SNF vs. ALF  Subjective:  Patient reports continued improvement in her dyspnea. Patient discussing with family regarding returning to her ALF ( would they be able to provide enough care?) vs. A short term stay at a SNF.   Objective: Temp:  [97.7 F (36.5 C)-98.3 F (36.8 C)] 97.7 F (36.5 C) (12/08 1457) Pulse Rate:  [79-84] 84 (12/08 1457) Resp:  [16-18] 16 (12/08 1457) BP: (138-144)/(68-78) 144/78 mmHg (12/08 1457) SpO2:  [92 %-96 %] 94 % (12/08 1457) Weight:  [100 lb 15.5 oz (45.8 kg)] 100 lb 15.5 oz (45.8 kg) (12/08 0554) Physical Exam: General: pleasant, cachectic female lying in bed in NAD  Cardiovascular: RRR. No murmurs appreciated.  Respiratory: CTAB. Normal WOB.  Abdomen: +BS, soft, NTND  Extremities: trace edema in R LE with no edema in L LE   Laboratory:  Recent Labs Lab 11/02/15 0234 11/03/15 0708 11/04/15 0722  WBC 2.6* 5.4 4.9  HGB 11.9* 10.8* 11.3*  HCT 37.7 33.7* 36.5  PLT 134* 128* 146*    Recent Labs Lab 11/01/15 1755 11/02/15 0840 11/03/15 1016 11/04/15 0722  NA 141 138 145 141  K 3.3* 3.1* 3.1* 4.1  CL 108 101 106 107  CO2 BUN 15 15 22* 24*  CREATININE 0.62 0.76 0.81 0.65  CALCIUM 8.7* 8.8* 9.4 8.9  PROT 6.2*  --   --   --   BILITOT 0.7  --   --   --  ALKPHOS 65  --   --   --   ALT 17  --   --   --   AST 22  --   --   --   GLUCOSE 150* 172* 75 79   BNP 320.5  i StatTroponin 0.00  Troponin <0.03 >> <0.03  Lactic acid 1.97  EKG: Afib, but regular rate 2/2 ventricular pacing, IVCD, nonischemic, grossly unchanged from previous   Imaging/Diagnostic Tests: Dg Chest 2 View  10/15/2015  CLINICAL DATA:  Shortness of breath. EXAM: CHEST  2 VIEW COMPARISON:  None. FINDINGS: Dual lead cardiac pacemaker is seen with leads overlying the right atrium and right ventricle. Cardiomediastinal silhouette is normal. Mediastinal contours appear intact. There is no evidence of  focal airspace consolidation, or pneumothorax. There are increased interstitial markings on the background of emphysematous changes. There are bilateral small pleural effusions with associated subsegmental atelectasis. There is a severe anterior compression deformity of 1 of the mid thoracic vertebral bodies with unknown acuity. Multilevel osteoarthritic changes are also seen. Soft tissues are grossly normal. IMPRESSION: Bilateral pleural effusions with bibasilar subsegmental atelectasis. Mildly increased interstitial markings on the background of emphysematous changes, which may represent an element of pulmonary edema. Anterior compression deformity of 1 of the mid thoracic vertebral bodies with unknown acuity. Electronically Signed   By: Ted Mcalpineobrinka  Dimitrova M.D.   On: 10/15/2015 18:45   Ct Angio Chest Pe W/cm &/or Wo Cm  11/02/2015  CLINICAL DATA:  81 year old female with worsening shortness of breath EXAM: CT ANGIOGRAPHY CHEST WITH CONTRAST TECHNIQUE: Multidetector CT imaging of the chest was performed using the standard protocol during bolus administration of intravenous contrast. Multiplanar CT image reconstructions and MIPs were obtained to evaluate the vascular anatomy. CONTRAST:  80mL OMNIPAQUE IOHEXOL 350 MG/ML SOLN COMPARISON:  Chest radiograph dated 11/01/2015 and 10/15/2015 FINDINGS: There is severe bullous emphysematous changes of the lungs. There is a 2.9 x 4.8 cm cavitary lesion in the left upper lobe with thickened wall and internal debris which may represent an infected bulla. Other infectious process are not excluded. The cavitary neoplasm is less likely. There is extension of airspace disease from the left apical region to the left hilum. A focal area of consolidation is noted at the right lung base, likely infectious in etiology. Underlying mass is not excluded. Clinical correlation and follow-up resolution is recommended. There are fibrotic changes and scarring of the left upper lobe with  volume loss. There is no pleural effusion or pneumothorax. Bold The central airways are patent. There is atherosclerotic calcification of the thoracic aorta. There is dilatation of the main pulmonary trunk indicative of a degree of pulmonary hypertension. There is a small filling defect in the subsegmental left lower lobe pulmonary artery branch (series 406 image 154). Small right upper lobe pulmonary artery nonocclusive/ peripheral filling defect may represent chronic thrombus/scarring. There is no cardiomegaly or pericardial effusion. There is coronary vascular calcification. Top-normal bilateral hilar lymph nodes. No mediastinal adenopathy. The thyroid gland is not well visualized. The esophagus is grossly unremarkable. There is no axillary adenopathy. Left pectoral pacemaker device. There is diffuse subcutaneous soft tissue stranding of the chest wall. There is extensive degenerative changes of the spine no acute fracture. Cholecystectomy. The visualized upper abdomen is grossly unremarkable. Review of the MIP images confirms the above findings. IMPRESSION: Small left lower lobe subsegmental pulmonary artery branch embolus. Severe emphysema with left apical cavitary opacity, likely a bulla with superimposed pneumonia. Right lung base focal consolidation most compatible with  pneumonia. Clinical correlation and follow-up recommended. Dilated main pulmonary trunk compatible with a degree of pulmonary hypertension. Critical Value/emergent results were called by telephone at the time of interpretation on 11/02/2015 at 12:41 am to nurse Le who verbally acknowledged these results. Electronically Signed   By: Elgie Collard M.D.   On: 11/02/2015 00:42   Dg Chest Port 1 View  11/01/2015  CLINICAL DATA:  Chest and back pain x 1 week w/ sob. pna in Sept 2016. Bronchitis 2 wks. No fever. No n/v. Htn. No diab. Ex smoker. No hx mi or stroke. No hx tb. No jaw, neck, or arm pain. EXAM: PORTABLE CHEST - 1 VIEW COMPARISON:   10/15/2015 FINDINGS: Progressive airspace consolidation in the left upper lobe peripherally. Some increase in airspace opacities at the right lung base. Heart size normal. Atheromatous aorta. Left subclavian pacemaker stable. Persistent blunting of lateral costophrenic angles as before. No pneumothorax. Visualized skeletal structures are unremarkable. IMPRESSION: 1. Worsening airspace disease in the left upper lobe and right lower lobes since previous exam. Electronically Signed   By: Corlis Leak M.D.   On: 11/01/2015 17:03    Arvilla Market, DO 11/04/2015, 3:25 PM PGY-1, [place] [facility] FPTS Intern pager: [phone], text pages welcome

## 2015-11-04 NOTE — Clinical Social Work Note (Signed)
Clinical Social Work Assessment  Patient Details  Name: Brooke Curry MRN: [id number] Date of Birth: May 18, 1927  Date of referral:  11/04/15               Reason for consult:  Facility Placement                Permission sought to share information with:  Facility Sport and exercise psychologist, Family Supports Permission granted to share information::  Yes, Verbal Permission Granted  Name::     Brooke Curry  Agency::  [facility] SNF's   Relationship::  Son  Contact Information:  [phone]  Housing/Transportation Living arrangements for the past 2 months:  [place] of Information:  Patient Patient Interpreter Needed:  None Criminal Activity/Legal Involvement Pertinent to Current Situation/Hospitalization:  No - Comment as needed Significant Relationships:  Adult Children Lives with:  Self, Facility Resident Do you feel safe going back to the place where you live?  No Need for family participation in patient care:  Yes (Comment)  Care giving concerns:  CSW received referral for possible SNF placement at time of discharge. CSW met with patient regarding PT recommendation of SNF placement at time of discharge. Per patient, patient is currently unable to care for herself at patients Assisted Living facility due to patient's current physical needs and fall risk. Patient expressed understanding of PT recommendation and is agreeable to SNF placement at time of discharge. CSW to continue to follow and assist with discharge planning needs.   Social Worker assessment / plan:  CSW spoke with patient concerning possibility of rehab at [facility] before returning home.  Employment status:  Retired Forensic scientist:  Medicare PT Recommendations:  [place] / Referral to community resources:  [place]  Patient/Family's Response to care:  Patient recognizes need for rehab before returning to ALF and is agreeable to a SNF in [place]. Patient reported preference for [facility].  Patient/Family's Understanding of and Emotional Response to Diagnosis, Current Treatment, and Prognosis:  Patient is realistic regarding therapy needs. No questions/concerns about plan or treatment.    Emotional Assessment Appearance:  Appears stated age Attitude/Demeanor/Rapport:   (Calm ) Affect (typically observed):  Accepting, Appropriate Orientation:  Oriented to Self, Oriented to Place, Oriented to  Time, Oriented to Situation Alcohol / Substance use:  Not Applicable Psych involvement (Current and /or in the community):  No (Comment)  Discharge Needs  Concerns to be addressed:  Care Coordination Readmission within the last 30 days:  No Current discharge risk:  None Barriers to Discharge:  No Barriers Identified   Benard Halsted, Luis M. Cintron 11/04/2015, 1:54 PM

## 2015-11-04 NOTE — NC FL2 (Signed)
[place] MEDICAID FL2 LEVEL OF CARE SCREENING TOOL     IDENTIFICATION  Patient Name: Brooke Curry Birthdate: 1927/08/16 Sex: female Admission Date (Current Location): 11/01/2015  [facility] [place]   Facility and Address:  [facility], [address], [place], [place] [address]      Provider Number: [id number]  Attending Physician Name and Address:  Moses MannersWilliam A Hensel, MD  Relative Name and Phone Number:       Current Level of Care: Hospital Recommended Level of Care: Skilled Nursing Facility Prior Approval Number:    Date Approved/Denied:   PASRR Number: [id number]  Discharge Plan: Other (Comment)    Current Diagnoses: Patient Active Problem List   Diagnosis Date Noted  . HAP (hospital-acquired pneumonia)   . Pulmonary embolism without acute cor pulmonale (HCC)   . Dyspnea 11/01/2015  . Essential hypertension 08/26/2015  . Sick sinus syndrome (HCC) 08/26/2015  . Atrial fibrillation (HCC) 08/26/2015    Orientation ACTIVITIES/SOCIAL BLADDER RESPIRATION    Self, Time, Situation, Place  Family supportive Continent O2 (As needed) (Nasal Cannula, 1L/min)  BEHAVIORAL SYMPTOMS/MOOD NEUROLOGICAL BOWEL NUTRITION STATUS  Other (Comment)  (N/A) Continent  (Heart Healthy)  PHYSICIAN VISITS COMMUNICATION OF NEEDS Height & Weight Skin    Verbally 5' (152.4 cm) 100 lbs. Normal          AMBULATORY STATUS RESPIRATION    Assist extensive O2 (As needed) (Nasal Cannula, 1L/min)      Personal Care Assistance Level of Assistance  Bathing, Feeding, Dressing Bathing Assistance: Limited assistance Feeding assistance: Limited assistance Dressing Assistance: Limited assistance      Functional Limitations Info                SPECIAL CARE FACTORS FREQUENCY  PT (By licensed PT), OT (By licensed OT)     PT Frequency: 3x/week OT Frequency: 2x/week           Additional Factors Info  Code Status, Allergies Code Status Info:  Full Allergies Info: NKA           Current Medications (11/04/2015):  This is the current hospital active medication list Current Facility-Administered Medications  Medication Dose Route Frequency Provider Last Rate Last Dose  . 0.9 %  sodium chloride infusion  250 mL Intravenous PRN Erasmo DownerAngela M Bacigalupo, MD      . acetaminophen (TYLENOL) tablet 650 mg  650 mg Oral Q6H PRN Erasmo DownerAngela M Bacigalupo, MD   650 mg at 11/03/15 0301   Or  . acetaminophen (TYLENOL) suppository 650 mg  650 mg Rectal Q6H PRN Erasmo DownerAngela M Bacigalupo, MD      . albuterol (PROVENTIL) (2.5 MG/3ML) 0.083% nebulizer solution 2.5 mg  2.5 mg Nebulization Q2H PRN Erasmo DownerAngela M Bacigalupo, MD      . antiseptic oral rinse (CPC / CETYLPYRIDINIUM CHLORIDE 0.05%) solution 7 mL  7 mL Mouth Rinse BID Moses MannersWilliam A Hensel, MD   7 mL at 11/04/15 0942  . apixaban (ELIQUIS) tablet 10 mg  10 mg Oral BID Moses MannersWilliam A Hensel, MD   10 mg at 11/04/15 0941  . [START ON 11/09/2015] apixaban (ELIQUIS) tablet 5 mg  5 mg Oral BID Moses MannersWilliam A Hensel, MD      . cholecalciferol (VITAMIN D) tablet 1,000 Units  1,000 Units Oral Daily Erasmo DownerAngela M Bacigalupo, MD   1,000 Units at 11/04/15 0941  . diphenhydrAMINE (BENADRYL) capsule 25 mg  25 mg Oral QHS PRN Arvilla Marketatherine Lauren Wallace, DO      . docusate  sodium (COLACE) capsule 100 mg  100 mg Oral BID Erasmo Downer, MD   100 mg at 11/04/15 0941  . famotidine (PEPCID) tablet 20 mg  20 mg Oral Daily Erasmo Downer, MD   20 mg at 11/04/15 0941  . ipratropium-albuterol (DUONEB) 0.5-2.5 (3) MG/3ML nebulizer solution 3 mL  3 mL Nebulization BID Moses Manners, MD      . Melatonin TABS 3 mg  3 mg Oral QHS Moses Manners, MD   3 mg at 11/03/15 2138  . metoprolol tartrate (LOPRESSOR) tablet 12.5 mg  12.5 mg Oral BID Erasmo Downer, MD   12.5 mg at 11/04/15 0941  . mirtazapine (REMERON) tablet 3.75 mg  3.75 mg Oral QHS Erasmo Downer, MD   3.75 mg at 11/03/15 2139  . pantoprazole (PROTONIX) EC tablet 40 mg  40 mg Oral  Daily Erasmo Downer, MD   40 mg at 11/04/15 0941  . polyethylene glycol (MIRALAX / GLYCOLAX) packet 17 g  17 g Oral Daily PRN Erasmo Downer, MD   17 g at 11/03/15 2138  . predniSONE (DELTASONE) tablet 50 mg  50 mg Oral Q breakfast Erasmo Downer, MD   50 mg at 11/04/15 0834  . sodium chloride 0.9 % injection 3 mL  3 mL Intravenous Q12H Erasmo Downer, MD   3 mL at 11/04/15 0942  . sodium chloride 0.9 % injection 3 mL  3 mL Intravenous Q12H Erasmo Downer, MD   3 mL at 11/04/15 0942  . sodium chloride 0.9 % injection 3 mL  3 mL Intravenous PRN Erasmo Downer, MD         Discharge Medications: Please see discharge summary for a list of discharge medications.  Relevant Imaging Results:  Relevant Lab Results:  Recent Labs    Additional Information SSN: [id number]  Renne Crigler Tong Pieczynski, LCSWA

## 2015-11-04 NOTE — [place] FL2 (Deleted)
[place] MEDICAID FL2 LEVEL OF CARE SCREENING TOOL     IDENTIFICATION  Patient Name: Brooke Curry Birthdate: [birth year] Sex: female Admission Date (Current Location): 11/01/2015  [facility] [place]   Facility and Address:  [facility], [address], [place], [place] [address]      Provider Number: [id number]  Attending Physician Name and Address:  Moses MannersWilliam A Hensel, MD  Relative Name and Phone Number:       Current Level of Care: Hospital Recommended Level of Care: Assisted Living Facility Prior Approval Number:    Date Approved/Denied:   PASRR Number:    Discharge Plan: Other (Comment)    Current Diagnoses: Patient Active Problem List   Diagnosis Date Noted  . HAP (hospital-acquired pneumonia)   . Pulmonary embolism without acute cor pulmonale (HCC)   . Dyspnea 11/01/2015  . Essential hypertension 08/26/2015  . Sick sinus syndrome (HCC) 08/26/2015  . Atrial fibrillation (HCC) 08/26/2015    Orientation ACTIVITIES/SOCIAL BLADDER RESPIRATION    Self, Time, Situation, Place  Family supportive Continent O2 (As needed) (Nasal Cannula, 1L/min)  BEHAVIORAL SYMPTOMS/MOOD NEUROLOGICAL BOWEL NUTRITION STATUS  Other (Comment)  (N/A) Continent  (Heart Healthy)  PHYSICIAN VISITS COMMUNICATION OF NEEDS Height & Weight Skin    Verbally 5' (152.4 cm) 100 lbs. Normal          AMBULATORY STATUS RESPIRATION    Assist extensive O2 (As needed) (Nasal Cannula, 1L/min)      Personal Care Assistance Level of Assistance  Bathing, Feeding, Dressing Bathing Assistance: Limited assistance Feeding assistance: Limited assistance Dressing Assistance: Limited assistance      Functional Limitations Info                SPECIAL CARE FACTORS FREQUENCY  PT (By licensed PT), OT (By licensed OT)     PT Frequency: 3x/week OT Frequency: 2x/week           Additional Factors Info  Code Status, Allergies Code Status Info:  Full Allergies Info: NKA           Current Medications (11/04/2015):   Discharge Medications: Medication List    ASK your doctor about these medications       ALPRAZolam 0.25 MG tablet  Commonly known as: XANAX  Take 0.25 mg by mouth at bedtime as needed for sleep.     cholecalciferol 1000 UNITS tablet  Commonly known as: VITAMIN D  Take 1,000 Units by mouth daily.     guaiFENesin 600 MG 12 hr tablet  Commonly known as: MUCINEX  Take 600 mg by mouth 2 (two) times daily.     ibuprofen 200 MG tablet  Commonly known as: ADVIL,MOTRIN  Take 200 mg by mouth every 4 (four) hours as needed.     LORazepam 0.5 MG tablet  Commonly known as: ATIVAN  Take 0.5 mg by mouth every 8 (eight) hours as needed for anxiety.     magnesium hydroxide 400 MG/5ML suspension  Commonly known as: MILK OF MAGNESIA  Take 60 mLs by mouth daily as needed for mild constipation.     Melatonin 5 MG Tabs  Take 5 mg by mouth at bedtime.     metoprolol tartrate 25 MG tablet  Commonly known as: LOPRESSOR  Take 12.5 mg by mouth 2 (two) times daily.     mirtazapine 7.5 MG tablet  Commonly known as: REMERON  Take 3.75 mg by mouth at bedtime.     omeprazole 20 MG capsule  Commonly  known as: PRILOSEC  Take 20 mg by mouth daily.     ondansetron 4 MG tablet  Commonly known as: ZOFRAN  Take 4 mg by mouth every 8 (eight) hours as needed for nausea or vomiting.     polyethylene glycol packet  Commonly known as: MIRALAX / GLYCOLAX  Take 17 g by mouth daily as needed for mild constipation.     predniSONE 20 MG tablet  Commonly known as: DELTASONE  Take 40 mg by mouth daily with breakfast. x2 days     ranitidine 150 MG tablet  Commonly known as: ZANTAC  Take 150 mg by mouth at bedtime.           Relevant Imaging Results:  Relevant Lab Results:  Recent Labs    Additional Information    Mearl Latin,  LCSWA

## 2015-11-04 NOTE — [place] FL2 (Deleted)
[place] MEDICAID FL2 LEVEL OF CARE SCREENING TOOL     IDENTIFICATION  Patient Name: Brooke Curry Birthdate: [birth year] Sex: female Admission Date (Current Location): 11/01/2015  [facility] [place]   Facility and Address:  [facility], [address], [place], [place] [address]      Provider Number: [id number]  Attending Physician Name and Address:  Moses MannersWilliam A Hensel, MD  Relative Name and Phone Number:       Current Level of Care: Hospital Recommended Level of Care: SNF Prior Approval Number:    Date Approved/Denied:   PASRR Number: [id number]   Discharge Plan: SNF    Current Diagnoses: Patient Active Problem List   Diagnosis Date Noted  . HAP (hospital-acquired pneumonia)   . Pulmonary embolism without acute cor pulmonale (HCC)   . Dyspnea 11/01/2015  . Essential hypertension 08/26/2015  . Sick sinus syndrome (HCC) 08/26/2015  . Atrial fibrillation (HCC) 08/26/2015    Orientation ACTIVITIES/SOCIAL BLADDER RESPIRATION    Self, Time, Situation, Place  Family supportive Continent O2 (As needed) (Nasal Cannula, 1L/min)  BEHAVIORAL SYMPTOMS/MOOD NEUROLOGICAL BOWEL NUTRITION STATUS  Other (Comment) (N/A)  (N/A) Continent  (Heart Healthy)  PHYSICIAN VISITS COMMUNICATION OF NEEDS Height & Weight Skin    Verbally 5' (152.4 cm) 100 lbs. Normal          AMBULATORY STATUS RESPIRATION    Assist extensive O2 (As needed) (Nasal Cannula, 1L/min)      Personal Care Assistance Level of Assistance  Bathing, Feeding, Dressing Bathing Assistance: Limited assistance Feeding assistance: Limited assistance Dressing Assistance: Limited assistance      Functional Limitations Info                SPECIAL CARE FACTORS FREQUENCY  PT (By licensed PT), OT (By licensed OT)     PT Frequency: 3x/week OT Frequency: 2x/week           Additional Factors Info  Code Status, Allergies Code Status Info: Full Allergies Info:  NKA           Current Medications (11/04/2015):    Discharge Medications: Medication List    ASK your doctor about these medications       ALPRAZolam 0.25 MG tablet  Commonly known as: XANAX  Take 0.25 mg by mouth at bedtime as needed for sleep.     cholecalciferol 1000 UNITS tablet  Commonly known as: VITAMIN D  Take 1,000 Units by mouth daily.     guaiFENesin 600 MG 12 hr tablet  Commonly known as: MUCINEX  Take 600 mg by mouth 2 (two) times daily.     ibuprofen 200 MG tablet  Commonly known as: ADVIL,MOTRIN  Take 200 mg by mouth every 4 (four) hours as needed.     LORazepam 0.5 MG tablet  Commonly known as: ATIVAN  Take 0.5 mg by mouth every 8 (eight) hours as needed for anxiety.     magnesium hydroxide 400 MG/5ML suspension  Commonly known as: MILK OF MAGNESIA  Take 60 mLs by mouth daily as needed for mild constipation.     Melatonin 5 MG Tabs  Take 5 mg by mouth at bedtime.     metoprolol tartrate 25 MG tablet  Commonly known as: LOPRESSOR  Take 12.5 mg by mouth 2 (two) times daily.     mirtazapine 7.5 MG tablet  Commonly known as: REMERON  Take 3.75 mg by mouth at bedtime.     omeprazole 20 MG capsule  Commonly known  as: PRILOSEC  Take 20 mg by mouth daily.     ondansetron 4 MG tablet  Commonly known as: ZOFRAN  Take 4 mg by mouth every 8 (eight) hours as needed for nausea or vomiting.     polyethylene glycol packet  Commonly known as: MIRALAX / GLYCOLAX  Take 17 g by mouth daily as needed for mild constipation.     predniSONE 20 MG tablet  Commonly known as: DELTASONE  Take 40 mg by mouth daily with breakfast. x2 days     ranitidine 150 MG tablet  Commonly known as: ZANTAC  Take 150 mg by mouth at bedtime.           Relevant Imaging Results:  Relevant Lab Results:  Recent Labs    Additional Information SSN: [id number]  Renne Crigler Tempest Frankland,  LCSWA

## 2015-11-04 NOTE — Care Management Important Message (Signed)
Important Message  Patient Details  Name: Brooke Curry MRN: [id number] Date of Birth: [birth year]   Medicare Important Message Given:  Yes    Kyla BalzarineShealy, Deshanna Kama Abena 11/04/2015, 1:58 PM

## 2015-11-04 NOTE — Progress Notes (Signed)
Speech Language Pathology Treatment: Dysphagia  Patient Details Name: Brooke Curry MRN: [id number] DOB: Apr 16, 1927 Today's Date: 11/04/2015 Time: 8119-14781053-1105 SLP Time Calculation (min) (ACUTE ONLY): 12 min  Assessment / Plan / Recommendation Clinical Impression  Pt recalled two of three swallow precautions. Required moderate verbal and visual cues to implement during observation with snack. No s/s aspiration. Pt stated (accurately) that MD does not think she had an acute pna but rather scarring. Continue regular texture and thin liquids with one more session to reiterate precautions.    HPI HPI: Brooke Curry is a 81 y.o. with history of pacemaker, COPD, A-fib, HTN, pna x 3 and hernia repair admitted with presenting with dyspnea. Per MD notes, health has declined fairly rapidly in the past 6 months (6 months ago pt living alone, driving, and totally independent). Now resides in assisted living. CXR worsening airspace disease in the left upper lobe and right lower lobes since previous exam. No prior ST documentation found.      SLP Plan  Continue with current plan of care     Recommendations  Diet recommendations: Regular;Thin liquid Liquids provided via: Cup;No straw Medication Administration: Whole meds with liquid Supervision: Patient able to self feed;Intermittent supervision to cue for compensatory strategies Compensations: Slow rate;Small sips/bites;Multiple dry swallows after each bite/sip;Clear throat intermittently Postural Changes and/or Swallow Maneuvers: Seated upright 90 degrees;Upright 30-60 min after meal              Oral Care Recommendations: Oral care BID Follow up Recommendations: None Plan: Continue with current plan of care   Royce MacadamiaLitaker, Trenita Hulme Willis 11/04/2015, 11:07 AM  Breck CoonsLisa Willis Lonell FaceLitaker M.Ed [employer]-SLP Pager [phone]

## 2015-11-05 LAB — BASIC METABOLIC PANEL
Anion gap: 6 (ref 5–15)
BUN: 25 mg/dL — AB (ref 6–20)
CO2: 28 mmol/L (ref 22–32)
CREATININE: 0.64 mg/dL (ref 0.44–1.00)
Calcium: 8.4 mg/dL — ABNORMAL LOW (ref 8.9–10.3)
Chloride: 105 mmol/L (ref 101–111)
GFR calc Af Amer: >60 mL/min (ref >60)
GLUCOSE: 74 mg/dL (ref 65–99)
POTASSIUM: 3.7 mmol/L (ref 3.5–5.1)
Sodium: 139 mmol/L (ref 135–145)

## 2015-11-05 LAB — CBC
HCT: 38.1 % (ref 36.0–46.0)
Hemoglobin: 11.8 g/dL — ABNORMAL LOW (ref 12.0–15.0)
MCH: 30.5 pg (ref 26.0–34.0)
MCHC: 31 g/dL (ref 30.0–36.0)
MCV: 98.4 fL (ref 78.0–100.0)
PLATELETS: 144 10*3/uL — AB (ref 150–400)
RBC: 3.87 MIL/uL (ref 3.87–5.11)
RDW: 16.2 % — AB (ref 11.5–15.5)
WBC: 4.3 10*3/uL (ref 4.0–10.5)

## 2015-11-05 NOTE — Progress Notes (Signed)
Occupational Therapy Treatment Patient Details Name: Brooke PennerMary Curry MRN: [id number] DOB: 1927/08/25 Today's Date: 11/05/2015    History of present illness this 81 y.o. female admitted with worsening dyspnea.  CXR revealed worsening airspace disease Lt upper lobe and Rt lower lobes.  CTA showed LLL PE as well as consolidations concerning for PNA.  PMH includes COPD, HTN, A-fib   OT comments  Pt making progress towards goals.  Pt currently min guard with stand pivot transfers without AD.  Pt demonstrating ability to maintain standing balance with min guard while completing toileting hygiene and clothing management.  Therapist assisted with threading pant legs - pt would benefit from AE to assist with LB dressing.  Pt will benefit from continued OT services to increase ability to complete ADLs.    Follow Up Recommendations  Supervision/Assistance - 24 hour;SNF    Equipment Recommendations  None recommended by OT    Recommendations for Other Services      Precautions / Restrictions Precautions Precautions: Fall       Mobility Bed Mobility Overal bed mobility: Needs Assistance Bed Mobility: Supine to Sit;Sit to Supine     Supine to sit: Supervision Sit to supine: Min assist   General bed mobility comments: S for safety with HOB elevated and use of bed rails to self assist to EOB, required assist to bring LLE into bed at end of session  Transfers Overall transfer level: Needs assistance   Transfers: Sit to/from Stand;Stand Pivot Transfers Sit to Stand: Min guard Stand pivot transfers: Min assist       General transfer comment: Min/guard to min A to facilitate forward weight shift.  Cues for hand placement on bed.          ADL Overall ADL's : Needs assistance/impaired     Grooming: Wash/dry hands;Wash/dry face;Set up;Sitting           Upper Body Dressing : Set up;Sitting   Lower Body Dressing: Total assistance;Sit to/from stand   Toilet Transfer:  Stand-pivot;BSC;Minimal assistance   Toileting- Clothing Manipulation and Hygiene: Minimal assistance;Sit to/from stand       Functional mobility during ADLs: Minimal assistance General ADL Comments: Pt with very limited activity tolerance with sats dropping to 83% on 3L O2 with minimal exertion/activity requiring increased time to rebound >90%                Cognition   Behavior During Therapy: [facility] for tasks assessed/performed Overall Cognitive Status: No family/caregiver present to determine baseline cognitive functioning                      Pertinent Vitals/ Pain       Pain Assessment: No/denies pain         Frequency Min 2X/week     Progress Toward Goals  OT Goals(current goals can now be found in the care plan section)  Progress towards OT goals: Progressing toward goals     Plan Discharge plan remains appropriate       End of Session     Activity Tolerance Patient limited by fatigue   Patient Left in bed;with call bell/phone within reach   Nurse Communication Mobility status        Time: [id number] OT Time Calculation (min): 18 min  Charges: OT General Charges $OT Visit: 1 Procedure OT Treatments $Self Care/Home Management : 8-22 mins  Rosalio LoudHOXIE, Zora Glendenning, [phone]  11/05/2015, 10:03 AM

## 2015-11-05 NOTE — Progress Notes (Signed)
Patient will DC to: [facility] Anticipated DC date: 11/05/15 Family notified: Son, Nadine CountsBob Transport by: PTAR 11:30am  CSW signing off.  Cristobal GoldmannNadia Allan Minotti, [place] Clinical Social Worker [phone]

## 2015-11-05 NOTE — Progress Notes (Signed)
[facility] to receiving nurse Dewayne Hatch(Ann). Pt. And family made aware of transport plans. Removed IV and tele. D/C paperwork on pt. Chart. Pt. Dependent on 2L o2. PTAR to transport to facility will transport with O2.

## 2015-11-05 NOTE — Care Management Note (Signed)
Case Management Note  Patient Details  Name: Brooke PennerMary Curry MRN: [id number] Date of Birth: 07-07-1927  Subjective/Objective:         Patient dc to SNF today, CSW following.           Action/Plan:   Expected Discharge Date:                  Expected Discharge Plan:  Skilled Nursing Facility  In-House Referral:  Clinical Social Work  Discharge planning Services  CM Consult  Post Acute Care Choice:    Choice offered to:     DME Arranged:    DME Agency:     HH Arranged:    HH Agency:     Status of Service:  Completed, signed off  Medicare Important Message Given:  Yes Date Medicare IM Given:    Medicare IM give by:    Date Additional Medicare IM Given:    Additional Medicare Important Message give by:     If discussed at Long Length of Stay Meetings, dates discussed:    Additional Comments:  Leone Havenaylor, Thea Holshouser Clinton, RN 11/05/2015, 4:02 PM

## 2015-11-05 NOTE — Clinical Social Work Placement (Signed)
   CLINICAL SOCIAL WORK PLACEMENT  NOTE  Date:  11/05/2015  Patient Details  Name: Brooke Curry MRN: [id number] Date of Birth: Aug 11, 1927  Clinical Social Work is seeking post-discharge placement for this patient at the Skilled  Nursing Facility level of care (*CSW will initial, date and re-position this form in  chart as items are completed):  Yes   Patient/family provided with [place] Clinical Social Work Department's list of facilities offering this level of care within the geographic area requested by the patient (or if unable, by the patient's family).  Yes   Patient/family informed of their freedom to choose among providers that offer the needed level of care, that participate in Medicare, Medicaid or managed care program needed by the patient, have an available bed and are willing to accept the patient.  Yes   Patient/family informed of [place]'s ownership interest in [facility] and [facility], as well as of the fact that they are under no obligation to receive care at these facilities.  PASRR submitted to EDS on       PASRR number received on       Existing PASRR number confirmed on 11/04/15     FL2 transmitted to all facilities in geographic area requested by pt/family on 11/04/15     FL2 transmitted to all facilities within larger geographic area on       Patient informed that his/her managed care company has contracts with or will negotiate with certain facilities, including the following:        Yes   Patient/family informed of bed offers received.  [facility]     Physician recommends and patient chooses bed at      [facility].  Patient to be transferred to facility by PTAR     Patient family notified on 11/05/15 of transfer.  Name of family member notified:  Sal[place] SenateSon, Bob, [phone]     PHYSICIAN Please prepare prescriptions     Additional Comment:     _______________________________________________ Mearl LatinNadia S Iwao Shamblin, LCSWA 11/05/2015, 10:50 AM

## 2015-11-06 LAB — CULTURE, BLOOD (ROUTINE X 2)
CULTURE: NO GROWTH
CULTURE: NO GROWTH

## 2015-11-16 ENCOUNTER — Ambulatory Visit: Payer: Medicare Other | Admitting: Cardiology

## 2015-11-29 ENCOUNTER — Encounter (HOSPITAL_COMMUNITY): Payer: Self-pay | Admitting: Emergency Medicine

## 2015-11-29 ENCOUNTER — Emergency Department (HOSPITAL_COMMUNITY): Payer: Medicare Other

## 2015-11-29 ENCOUNTER — Inpatient Hospital Stay (HOSPITAL_COMMUNITY)
Admission: EM | Admit: 2015-11-29 | Discharge: 2015-12-03 | DRG: 190 | Disposition: A | Discharge status: Skilled Nursing Facility | Payer: Medicare Other | Attending: Internal Medicine | Admitting: Internal Medicine

## 2015-11-29 DIAGNOSIS — Z8249 Family history of ischemic heart disease and other diseases of the circulatory system: Secondary | ICD-10-CM

## 2015-11-29 DIAGNOSIS — Z79899 Other long term (current) drug therapy: Secondary | ICD-10-CM

## 2015-11-29 DIAGNOSIS — R739 Hyperglycemia, unspecified: Secondary | ICD-10-CM

## 2015-11-29 DIAGNOSIS — Z87891 Personal history of nicotine dependence: Secondary | ICD-10-CM

## 2015-11-29 DIAGNOSIS — Z9981 Dependence on supplemental oxygen: Secondary | ICD-10-CM

## 2015-11-29 DIAGNOSIS — R000 Tachycardia, unspecified: Secondary | ICD-10-CM | POA: Diagnosis present

## 2015-11-29 DIAGNOSIS — Z7901 Long term (current) use of anticoagulants: Secondary | ICD-10-CM

## 2015-11-29 DIAGNOSIS — N320 Bladder-neck obstruction: Secondary | ICD-10-CM | POA: Diagnosis present

## 2015-11-29 DIAGNOSIS — J440 Chronic obstructive pulmonary disease with acute lower respiratory infection: Principal | ICD-10-CM | POA: Diagnosis present

## 2015-11-29 DIAGNOSIS — Z950 Presence of cardiac pacemaker: Secondary | ICD-10-CM | POA: Diagnosis present

## 2015-11-29 DIAGNOSIS — F419 Anxiety disorder, unspecified: Secondary | ICD-10-CM | POA: Diagnosis present

## 2015-11-29 DIAGNOSIS — Y95 Nosocomial condition: Secondary | ICD-10-CM | POA: Diagnosis present

## 2015-11-29 DIAGNOSIS — K219 Gastro-esophageal reflux disease without esophagitis: Secondary | ICD-10-CM

## 2015-11-29 DIAGNOSIS — Z809 Family history of malignant neoplasm, unspecified: Secondary | ICD-10-CM

## 2015-11-29 DIAGNOSIS — Z7401 Bed confinement status: Secondary | ICD-10-CM

## 2015-11-29 DIAGNOSIS — I482 Chronic atrial fibrillation: Secondary | ICD-10-CM | POA: Diagnosis present

## 2015-11-29 DIAGNOSIS — R0602 Shortness of breath: Secondary | ICD-10-CM | POA: Diagnosis not present

## 2015-11-29 DIAGNOSIS — J189 Pneumonia, unspecified organism: Secondary | ICD-10-CM | POA: Diagnosis present

## 2015-11-29 DIAGNOSIS — I10 Essential (primary) hypertension: Secondary | ICD-10-CM | POA: Diagnosis present

## 2015-11-29 DIAGNOSIS — I4891 Unspecified atrial fibrillation: Secondary | ICD-10-CM | POA: Diagnosis present

## 2015-11-29 DIAGNOSIS — J441 Chronic obstructive pulmonary disease with (acute) exacerbation: Secondary | ICD-10-CM

## 2015-11-29 LAB — TROPONIN I: Troponin I: <0.03 ng/mL (ref <0.031)

## 2015-11-29 LAB — CBC WITH DIFFERENTIAL/PLATELET
BASOS ABS: 0 10*3/uL (ref 0.0–0.1)
Basophils Relative: 0 %
EOS PCT: 0 %
Eosinophils Absolute: 0 10*3/uL (ref 0.0–0.7)
HCT: 38.9 % (ref 36.0–46.0)
Hemoglobin: 12.5 g/dL (ref 12.0–15.0)
LYMPHS PCT: 6 %
Lymphs Abs: 0.2 10*3/uL — ABNORMAL LOW (ref 0.7–4.0)
MCH: 31.8 pg (ref 26.0–34.0)
MCHC: 32.1 g/dL (ref 30.0–36.0)
MCV: 99 fL (ref 78.0–100.0)
MONO ABS: 0 10*3/uL — AB (ref 0.1–1.0)
Monocytes Relative: 1 %
Neutro Abs: 3.2 10*3/uL (ref 1.7–7.7)
Neutrophils Relative %: 93 %
PLATELETS: 167 10*3/uL (ref 150–400)
RBC: 3.93 MIL/uL (ref 3.87–5.11)
RDW: 16.5 % — AB (ref 11.5–15.5)
WBC: 3.5 10*3/uL — ABNORMAL LOW (ref 4.0–10.5)

## 2015-11-29 LAB — BASIC METABOLIC PANEL
Anion gap: 10 (ref 5–15)
BUN: 21 mg/dL — AB (ref 6–20)
CO2: 22 mmol/L (ref 22–32)
CREATININE: 0.79 mg/dL (ref 0.44–1.00)
Calcium: 8.8 mg/dL — ABNORMAL LOW (ref 8.9–10.3)
Chloride: 106 mmol/L (ref 101–111)
GFR calc Af Amer: >60 mL/min (ref >60)
GLUCOSE: 331 mg/dL — AB (ref 65–99)
POTASSIUM: 3.9 mmol/L (ref 3.5–5.1)
Sodium: 138 mmol/L (ref 135–145)

## 2015-11-29 LAB — D-DIMER, QUANTITATIVE: D-Dimer, Quant: 1.75 ug/mL-FEU — ABNORMAL HIGH (ref 0.00–0.50)

## 2015-11-29 MED ORDER — ONDANSETRON HCL 4 MG/2ML IJ SOLN
4.0000 mg | Freq: Once | INTRAMUSCULAR | Status: AC
  Administered 2015-11-29: 4 mg via INTRAVENOUS
  Filled 2015-11-29: qty 2

## 2015-11-29 MED ORDER — MORPHINE SULFATE (PF) 2 MG/ML IV SOLN
2.0000 mg | Freq: Once | INTRAVENOUS | Status: AC
  Administered 2015-11-29: 2 mg via INTRAVENOUS
  Filled 2015-11-29: qty 1

## 2015-11-29 NOTE — ED Notes (Signed)
Bed: WA09 Expected date:  Expected time:  Means of arrival:  Comments: EMS 

## 2015-11-29 NOTE — ED Notes (Signed)
Nurse drawing labs. 

## 2015-11-29 NOTE — ED Notes (Signed)
EKG given to Dr. Radford PaxBeaton

## 2015-11-29 NOTE — ED Notes (Signed)
Per EMS, [facility] [place] [facility].  Patient has a history of COPD and Pneumonia.  Patient used dual neub 4 times at home, no relief. Patient as history of A-Fib and Ventricular pacing  EMS administered 10 mg Albuterol, 0.5 Atrivan, and 125 mg of Sol-u-Medrol, and 2 grams of Mag.   No n/v/d.    BP:131/79 HR:92 97% with neub treatment.  Patient is now on 8 L with neub treatment R:28

## 2015-11-29 NOTE — ED Provider Notes (Signed)
CSN: [id number]     Arrival date & time 11/29/15  1924 History   First MD Initiated Contact with Patient 11/29/15 1939     Chief Complaint  Patient presents with  . Shortness of Breath     (Consider location/radiation/quality/duration/timing/severity/associated sxs/prior Treatment) HPI   Brooke Curry is a 82 y.o. female who presents for evaluation of shortness of breath, which is recurrent. This time it has been bothering her since yesterday. She is on chronic oxygen therapy and lives at a assisted living facility. She presented by EMS, during transfer was treated with a prolonged albuterol nebulizer, Ativan, Solu-Medrol and magnesium. On arrival, her oxygen saturation was normal. 97% during the treatment. Post nebulizer treatment. She developed some mid anterior chest pain which she states has been present since yesterday. She denies recent fever, chills, change in her cough, nausea, vomiting, weakness or dizziness. She has chronic intermittent chest pain, which radiates through to her back. There are no other known modifying factors.   Past Medical History  Diagnosis Date  . Pacemaker   . A-fib (HCC)   . Hypertension    Past Surgical History  Procedure Laterality Date  . Phrenic nerve pacemaker implantation    . Laparoscopic hysterectomy    . Cholecystectomy    . Pacemaker placement  2010  . Hernia repair     Family History  Problem Relation Age of Onset  . Heart disease Father   . Cancer Mother   . Cancer Brother   . Heart disease Brother    Social History  Substance Use Topics  . Smoking status: Former Games developermoker  . Smokeless tobacco: None  . Alcohol Use: 0.0 oz/week    0 Standard drinks or equivalent per week   OB History    No data available     Review of Systems  All other systems reviewed and are negative.     Allergies  Review of patient's allergies indicates no known allergies.  Home Medications   Prior to Admission medications   Medication Sig Start Date  End Date Taking? Authorizing Provider  albuterol (PROVENTIL) (2.5 MG/3ML) 0.083% nebulizer solution Take 3 mLs (2.5 mg total) by nebulization every 4 (four) hours as needed for wheezing. Patient taking differently: Take 2.5 mg by nebulization 4 (four) times daily.  11/04/15  Yes Arvilla Marketatherine Lauren Wallace, DO  apixaban (ELIQUIS) 5 MG TABS tablet Take 2 tablets (10 mg total) by mouth 2 (two) times daily. Through 12/13. Then, taken 1 tablet (5 mg) by mouth 2 (two) times daily. Patient taking differently: Take 5 mg by mouth 2 (two) times daily.  11/04/15  Yes Arvilla Marketatherine Lauren Wallace, DO  cholecalciferol (VITAMIN D) 1000 UNITS tablet Take 1,000 Units by mouth daily.   Yes Historical Provider, MD  diltiazem (CARDIZEM SR) 60 MG 12 hr capsule Take 60 mg by mouth 2 (two) times daily.   Yes Historical Provider, MD  diphenhydrAMINE (BENADRYL) 25 MG tablet Take 25 mg by mouth at bedtime.   Yes Historical Provider, MD  ENSURE (ENSURE) Take 237 mLs by mouth 4 (four) times daily.   Yes Historical Provider, MD  guaiFENesin (MUCINEX) 600 MG 12 hr tablet Take 600 mg by mouth 2 (two) times daily.   Yes Historical Provider, MD  LORazepam (ATIVAN) 0.5 MG tablet Take 0.5 mg by mouth every 8 (eight) hours as needed for anxiety.    Yes Historical Provider, MD  magnesium hydroxide (MILK OF MAGNESIA) 400 MG/5ML suspension Take 60 mLs by mouth daily as needed  for mild constipation.   Yes Historical Provider, MD  Melatonin 5 MG TABS Take 5 mg by mouth at bedtime.   Yes Historical Provider, MD  mirtazapine (REMERON) 7.5 MG tablet Take 3.75 mg by mouth at bedtime.   Yes Historical Provider, MD  omeprazole (PRILOSEC) 20 MG capsule Take 20 mg by mouth daily.   Yes Historical Provider, MD  ALPRAZolam Prudy Feeler) 0.25 MG tablet Take 0.25 mg by mouth at bedtime as needed for sleep.    Historical Provider, MD  ondansetron (ZOFRAN) 4 MG tablet Take 4 mg by mouth every 8 (eight) hours as needed for nausea or vomiting.    Historical Provider,  MD  polyethylene glycol (MIRALAX / GLYCOLAX) packet Take 17 g by mouth daily as needed for mild constipation.     Historical Provider, MD   BP 122/55 mmHg  Pulse 100  Temp(Src) 97.6 F (36.4 C) (Oral)  Resp 26  SpO2 93% Physical Exam  Constitutional: She is oriented to person, place, and time. She appears well-developed.  Frail, elderly  HENT:  Head: Normocephalic and atraumatic.  Right Ear: External ear normal.  Left Ear: External ear normal.  Eyes: Conjunctivae and EOM are normal. Pupils are equal, round, and reactive to light.  Neck: Normal range of motion and phonation normal. Neck supple.  Cardiovascular: Normal rate, regular rhythm and normal heart sounds.   Pulmonary/Chest: Effort normal. She exhibits no bony tenderness.  Tachypnea;  good air movement bilaterally without wheezes. Post nebulizer treatment  Abdominal: Soft. There is no tenderness.  Musculoskeletal: Normal range of motion. She exhibits no edema or tenderness.  Neurological: She is alert and oriented to person, place, and time. No cranial nerve deficit or sensory deficit. She exhibits normal muscle tone. Coordination normal.  Skin: Skin is warm, dry and intact.  Psychiatric: She has a normal mood and affect. Her behavior is normal. Judgment and thought content normal.  Nursing note and vitals reviewed.   ED Course  Procedures (including critical care time) Medications  morphine 2 MG/ML injection 2 mg (2 mg Intravenous Given 11/29/15 2237)  ondansetron (ZOFRAN) injection 4 mg (4 mg Intravenous Given 11/29/15 2236)    Patient Vitals for the past 24 hrs:  BP Temp Temp src Pulse Resp SpO2  11/29/15 2323 122/55 mmHg 97.6 F (36.4 C) Oral 100 26 93 %  11/29/15 2130 117/55 mmHg - - 80 (!) 30 91 %  11/29/15 1943 - - - - - 100 %  11/29/15 1938 138/71 mmHg 97.8 F (36.6 C) Oral 93 25 99 %    12:30 AM Reevaluation with update and discussion. After initial assessment and treatment, an updated evaluation reveals she  is, now with decreased respiratory rate. Continues to have normal oxygenation.Mancel Bale L    CRITICAL CARE Performed by: Flint Melter Total critical care time: 35 minutes Critical care time was exclusive of separately billable procedures and treating other patients. Critical care was necessary to treat or prevent imminent or life-threatening deterioration. Critical care was time spent personally by me on the following activities: development of treatment plan with patient and/or surrogate as well as nursing, discussions with consultants, evaluation of patient's response to treatment, examination of patient, obtaining history from patient or surrogate, ordering and performing treatments and interventions, ordering and review of laboratory studies, ordering and review of radiographic studies, pulse oximetry and re-evaluation of patient's condition.   Labs Review Labs Reviewed  BASIC METABOLIC PANEL - Abnormal; Notable for the following:    Glucose, Bld 331 (*)  BUN 21 (*)    Calcium 8.8 (*)    All other components within normal limits  CBC WITH DIFFERENTIAL/PLATELET - Abnormal; Notable for the following:    WBC 3.5 (*)    RDW 16.5 (*)    Lymphs Abs 0.2 (*)    Monocytes Absolute 0.0 (*)    All other components within normal limits  D-DIMER, QUANTITATIVE (NOT AT [facility]) - Abnormal; Notable for the following:    D-Dimer, Quant 1.75 (*)    All other components within normal limits  TROPONIN I    Imaging Review Dg Chest Port 1 View  11/29/2015  CLINICAL DATA:  Sob increased today. Patient used dual nebulizer 4 times at home, no relief. Hx COPD, pna, AFib, htn EXAM: PORTABLE CHEST 1 VIEW COMPARISON:  11/01/2015 CT and plain film FINDINGS: Lungs are hyperinflated. Again noted is complex cavitary lesion within the left lung apex. Significant emphysematous changes most notable at the right lung apex. Chronic lung markings at the medial right lung base. No focal consolidations or pleural  effusions are identified. Left-sided AICD leads to right atrium and right ventricle. IMPRESSION: 1. Chronic changes including left apical cavitary lesion and emphysematous changes. 2.  No evidence for acute cardiopulmonary abnormality. Electronically Signed   By: Norva Pavlov M.D.   On: 11/29/2015 23:01   I have personally reviewed and evaluated these images and lab results as part of my medical decision-making.   EKG Interpretation   Date/Time:  Monday November 29 2015 22:37:05 EST Ventricular Rate:  80 PR Interval:  150 QRS Duration: 134 QT Interval:  441 QTC Calculation: 509 R Axis:   -75 Text Interpretation:  Afib/flutter and ventricular-paced rhythm No further  analysis attempted due to paced rhythm Since last tracing of earlier today  No significant change was found Confirmed by Effie Shy  MD, Enrrique Mierzwa [phone]) on  11/29/2015 11:27:29 PM        EKG Interpretation  Date/Time:  Monday November 29 2015 22:37:05 EST Ventricular Rate:  80 PR Interval:  150 QRS Duration: 134 QT Interval:  441 QTC Calculation: 509 R Axis:   -75 Text Interpretation:  Afib/flutter and ventricular-paced rhythm No further analysis attempted due to paced rhythm Since last tracing of earlier today No significant change was found Confirmed by Effie Shy  MD, Meliton Samad ([id number]) on 11/29/2015 11:27:29 PM         MDM   Final diagnoses:  COPD exacerbation (HCC)  Hyperglycemia     Likely COPD exacerbation. Doubt ACS or pneumonia. CT angiogram, chest, ordered to evaluate for PE. Hyperglycemia is likely secondary to stress of dyspnea, as well as receiving steroid medication.  Nursing Notes Reviewed/ Care Coordinated, and agree without changes. Applicable Imaging Reviewed.  Interpretation of Laboratory Data incorporated into ED treatment   Plan- care to Dr. Adela Lank to evaluate after CT imaging  Mancel Bale, MD 12/01/15 [phone]

## 2015-11-29 NOTE — ED Notes (Signed)
Neub complete.  Patient is now on 3L of O2.  Which is what she is on at home.

## 2015-11-30 ENCOUNTER — Emergency Department (HOSPITAL_COMMUNITY): Payer: Medicare Other

## 2015-11-30 ENCOUNTER — Encounter (HOSPITAL_COMMUNITY): Payer: Self-pay

## 2015-11-30 DIAGNOSIS — Z7401 Bed confinement status: Secondary | ICD-10-CM | POA: Diagnosis not present

## 2015-11-30 DIAGNOSIS — Z7901 Long term (current) use of anticoagulants: Secondary | ICD-10-CM | POA: Diagnosis not present

## 2015-11-30 DIAGNOSIS — N320 Bladder-neck obstruction: Secondary | ICD-10-CM | POA: Diagnosis present

## 2015-11-30 DIAGNOSIS — Z87891 Personal history of nicotine dependence: Secondary | ICD-10-CM | POA: Diagnosis not present

## 2015-11-30 DIAGNOSIS — K219 Gastro-esophageal reflux disease without esophagitis: Secondary | ICD-10-CM | POA: Diagnosis not present

## 2015-11-30 DIAGNOSIS — R0602 Shortness of breath: Secondary | ICD-10-CM | POA: Diagnosis present

## 2015-11-30 DIAGNOSIS — I482 Chronic atrial fibrillation: Secondary | ICD-10-CM

## 2015-11-30 DIAGNOSIS — Z950 Presence of cardiac pacemaker: Secondary | ICD-10-CM | POA: Diagnosis not present

## 2015-11-30 DIAGNOSIS — F419 Anxiety disorder, unspecified: Secondary | ICD-10-CM | POA: Diagnosis present

## 2015-11-30 DIAGNOSIS — I480 Paroxysmal atrial fibrillation: Secondary | ICD-10-CM | POA: Diagnosis not present

## 2015-11-30 DIAGNOSIS — Z809 Family history of malignant neoplasm, unspecified: Secondary | ICD-10-CM | POA: Diagnosis not present

## 2015-11-30 DIAGNOSIS — Z8249 Family history of ischemic heart disease and other diseases of the circulatory system: Secondary | ICD-10-CM | POA: Diagnosis not present

## 2015-11-30 DIAGNOSIS — Z7189 Other specified counseling: Secondary | ICD-10-CM | POA: Diagnosis not present

## 2015-11-30 DIAGNOSIS — J189 Pneumonia, unspecified organism: Secondary | ICD-10-CM

## 2015-11-30 DIAGNOSIS — I10 Essential (primary) hypertension: Secondary | ICD-10-CM | POA: Diagnosis present

## 2015-11-30 DIAGNOSIS — Y95 Nosocomial condition: Secondary | ICD-10-CM | POA: Diagnosis present

## 2015-11-30 DIAGNOSIS — J440 Chronic obstructive pulmonary disease with acute lower respiratory infection: Secondary | ICD-10-CM | POA: Diagnosis present

## 2015-11-30 DIAGNOSIS — Z79899 Other long term (current) drug therapy: Secondary | ICD-10-CM | POA: Diagnosis not present

## 2015-11-30 DIAGNOSIS — Z515 Encounter for palliative care: Secondary | ICD-10-CM | POA: Diagnosis not present

## 2015-11-30 DIAGNOSIS — Z9981 Dependence on supplemental oxygen: Secondary | ICD-10-CM | POA: Diagnosis not present

## 2015-11-30 DIAGNOSIS — R000 Tachycardia, unspecified: Secondary | ICD-10-CM | POA: Diagnosis present

## 2015-11-30 DIAGNOSIS — R0600 Dyspnea, unspecified: Secondary | ICD-10-CM | POA: Diagnosis not present

## 2015-11-30 LAB — GLUCOSE, CAPILLARY
GLUCOSE-CAPILLARY: 108 mg/dL — AB (ref 65–99)
Glucose-Capillary: 243 mg/dL — ABNORMAL HIGH (ref 65–99)
Glucose-Capillary: 109 mg/dL — ABNORMAL HIGH (ref 65–99)
Glucose-Capillary: 115 mg/dL — ABNORMAL HIGH (ref 65–99)

## 2015-11-30 LAB — HIV ANTIBODY (ROUTINE TESTING W REFLEX): HIV Screen 4th Generation wRfx: NONREACTIVE

## 2015-11-30 MED ORDER — ENSURE ENLIVE PO LIQD
237.0000 mL | Freq: Four times a day (QID) | ORAL | Status: DC
Start: 2015-11-30 — End: 2015-12-03
  Administered 2015-11-30 – 2015-12-03 (×9): 237 mL via ORAL

## 2015-11-30 MED ORDER — PIPERACILLIN-TAZOBACTAM 3.375 G IVPB 30 MIN
3.3750 g | Freq: Once | INTRAVENOUS | Status: AC
  Administered 2015-11-30: 3.375 g via INTRAVENOUS
  Filled 2015-11-30: qty 50

## 2015-11-30 MED ORDER — PANTOPRAZOLE SODIUM 40 MG PO TBEC
40.0000 mg | DELAYED_RELEASE_TABLET | Freq: Every day | ORAL | Status: DC
  Administered 2015-11-30 – 2015-12-03 (×4): 40 mg via ORAL
  Filled 2015-11-30 (×4): qty 1

## 2015-11-30 MED ORDER — APIXABAN 5 MG PO TABS
5.0000 mg | ORAL_TABLET | Freq: Two times a day (BID) | ORAL | Status: DC
  Administered 2015-11-30 – 2015-12-03 (×7): 5 mg via ORAL
  Filled 2015-11-30 (×7): qty 1

## 2015-11-30 MED ORDER — IPRATROPIUM-ALBUTEROL 0.5-2.5 (3) MG/3ML IN SOLN
3.0000 mL | RESPIRATORY_TRACT | Status: DC | PRN
  Administered 2015-12-02: 3 mL via RESPIRATORY_TRACT
  Filled 2015-11-30 (×3): qty 3

## 2015-11-30 MED ORDER — VANCOMYCIN HCL IN DEXTROSE 750-5 MG/150ML-% IV SOLN
750.0000 mg | INTRAVENOUS | Status: DC
  Administered 2015-12-01 – 2015-12-03 (×3): 750 mg via INTRAVENOUS
  Filled 2015-11-30 (×3): qty 150

## 2015-11-30 MED ORDER — DEXTROSE 5 % IV SOLN
1.0000 g | INTRAVENOUS | Status: DC
  Administered 2015-11-30 – 2015-12-03 (×4): 1 g via INTRAVENOUS
  Filled 2015-11-30 (×6): qty 1

## 2015-11-30 MED ORDER — IPRATROPIUM-ALBUTEROL 0.5-2.5 (3) MG/3ML IN SOLN
3.0000 mL | Freq: Four times a day (QID) | RESPIRATORY_TRACT | Status: DC
  Administered 2015-11-30 (×3): 3 mL via RESPIRATORY_TRACT
  Filled 2015-11-30 (×4): qty 3

## 2015-11-30 MED ORDER — GUAIFENESIN ER 600 MG PO TB12
600.0000 mg | ORAL_TABLET | Freq: Two times a day (BID) | ORAL | Status: DC
  Administered 2015-11-30 – 2015-12-03 (×7): 600 mg via ORAL
  Filled 2015-11-30 (×6): qty 1

## 2015-11-30 MED ORDER — LORAZEPAM 0.5 MG PO TABS
0.5000 mg | ORAL_TABLET | Freq: Three times a day (TID) | ORAL | Status: DC | PRN
  Administered 2015-11-30 – 2015-12-03 (×4): 0.5 mg via ORAL
  Filled 2015-11-30 (×4): qty 1

## 2015-11-30 MED ORDER — LORAZEPAM 2 MG/ML IJ SOLN: 0.2500 mg | INTRAMUSCULAR | Status: AC

## 2015-11-30 MED ORDER — VANCOMYCIN HCL IN DEXTROSE 1-5 GM/200ML-% IV SOLN
1000.0000 mg | Freq: Once | INTRAVENOUS | Status: AC
  Administered 2015-11-30: 1000 mg via INTRAVENOUS
  Filled 2015-11-30: qty 200

## 2015-11-30 MED ORDER — INSULIN ASPART 100 UNIT/ML ~~LOC~~ SOLN
0.0000 [IU] | SUBCUTANEOUS | Status: DC
  Administered 2015-11-30: 3 [IU] via SUBCUTANEOUS
  Administered 2015-12-01 – 2015-12-02 (×2): 1 [IU] via SUBCUTANEOUS
  Administered 2015-12-03: 2 [IU] via SUBCUTANEOUS
  Administered 2015-12-03: 1 [IU] via SUBCUTANEOUS

## 2015-11-30 MED ORDER — DILTIAZEM HCL ER 60 MG PO CP12
60.0000 mg | ORAL_CAPSULE | Freq: Two times a day (BID) | ORAL | Status: DC
  Administered 2015-11-30 – 2015-12-03 (×7): 60 mg via ORAL
  Filled 2015-11-30 (×9): qty 1

## 2015-11-30 MED ORDER — MIRTAZAPINE 7.5 MG PO TABS
3.7500 mg | ORAL_TABLET | Freq: Every day | ORAL | Status: DC
  Administered 2015-11-30 – 2015-12-02 (×3): 3.75 mg via ORAL
  Filled 2015-11-30 (×4): qty 1

## 2015-11-30 MED ORDER — MELATONIN 5 MG PO TABS: 5.0000 mg | ORAL_TABLET | Freq: Every day | ORAL | Status: DC

## 2015-11-30 MED ORDER — POLYETHYLENE GLYCOL 3350 17 G PO PACK: 17.0000 g | PACK | Freq: Every day | ORAL | Status: DC | PRN

## 2015-11-30 MED ORDER — MORPHINE SULFATE (PF) 2 MG/ML IV SOLN
1.0000 mg | INTRAVENOUS | Status: DC | PRN
  Administered 2015-11-30 – 2015-12-03 (×12): 2 mg via INTRAVENOUS
  Filled 2015-11-30 (×12): qty 1

## 2015-11-30 MED ORDER — VITAMIN D3 25 MCG (1000 UNIT) PO TABS
1000.0000 [IU] | ORAL_TABLET | Freq: Every day | ORAL | Status: DC
  Administered 2015-11-30 – 2015-12-03 (×4): 1000 [IU] via ORAL
  Filled 2015-11-30 (×8): qty 1

## 2015-11-30 MED ORDER — IOHEXOL 350 MG/ML SOLN
80.0000 mL | Freq: Once | INTRAVENOUS | Status: AC | PRN
  Administered 2015-11-30: 80 mL via INTRAVENOUS

## 2015-11-30 MED ORDER — DIPHENHYDRAMINE HCL 25 MG PO TABS: 25.0000 mg | ORAL_TABLET | Freq: Every day | ORAL | Status: DC

## 2015-11-30 MED ORDER — MAGNESIUM HYDROXIDE 400 MG/5ML PO SUSP
60.0000 mL | Freq: Every day | ORAL | Status: DC | PRN
  Administered 2015-12-01: 60 mL via ORAL
  Filled 2015-11-30: qty 60

## 2015-11-30 NOTE — ED Provider Notes (Signed)
Received patient in turnover from Dr. Effie ShyWentz, patient is a 82 year old female with a chief complaint shortness of breath. Patient has history of COPD is on oxygen 3 L at all times. Feels like she is really having trouble on exertion. Increased cough increased sputum production is thought to be likely a COPD exacerbation. However patient was very tachypnic after breathing treatments. The patient was found to have an elevated d-dimer. CT scan was negative for PE however it's concerning for pneumonia. Patient's curb 65 is a 2. Patient also was recently in the hospital and treated. Feel patient needs treatment for HCAP. Will admit. 1. HCAP (healthcare-associated pneumonia)   2. COPD exacerbation (HCC)   3. Hyperglycemia      Melene Planan [place], DO 11/30/15 [phone]

## 2015-11-30 NOTE — Progress Notes (Signed)
PHARMACIST - PHYSICIAN ORDER COMMUNICATION  CONCERNING: P&T Medication Policy on Herbal Medications  DESCRIPTION:  This patient's order for:  Melatonin  has been noted.  This product(s) is classified as an "herbal" or natural product. Due to a lack of definitive safety studies or FDA approval, nonstandard manufacturing practices, plus the potential risk of unknown drug-drug interactions while on inpatient medications, the Pharmacy and Therapeutics Committee does not permit the use of "herbal" or natural products of this type within [facility].   ACTION TAKEN: The pharmacy department is unable to verify this order at this time and your patient has been informed of this safety policy. Please reevaluate patient's clinical condition at discharge and address if the herbal or natural product(s) should be resumed at that time.  Otho BellowsGreen, Jayelle Page L PharmD Pager [phone] 11/30/2015, 7:56 AM

## 2015-11-30 NOTE — Progress Notes (Signed)
Patient admitted earlier this morning. H&P reviewed. Patient seen and examined.  S: Patient feels short of breath. Denies any chest pain per se. Cough with whitish expectoration. No nausea, vomiting. Had a swallow evaluation during previous hospitalization.  O: Has being afebrile. Vital signs are stable.  Lungs reveal diminished air entry at the bases. Crackles present in the right base. No rhonchi or wheezing. S1, S2 is irregularly irregular. No S3, S4. Abdomen is soft, nontender, nondistended. No edema noted in the lower extremities. She is alert and oriented 3. No focal neurological deficits present.  A/P: Patient admitted for pneumonia. She was hospitalized in December. Hence, this is healthcare associated pneumonia. Continue broad-spectrum antibiotics. Continue oxygen. Mobilize as tolerated. The rest of the issues as per H&P.  Osvaldo ShipperKRISHNAN,Mujtaba Bollig 11/30/2015

## 2015-11-30 NOTE — Evaluation (Signed)
Physical Therapy Evaluation Patient Details Name: Brooke Curry MRN: [id number] DOB: 09-17-1927 Today's Date: 11/30/2015   History of Present Illness  82 yo female admitted with Pna. Hx of COPD, HTN, A fib  Clinical Impression  On eval, pt required Min assist for mobility-performed stand pivot x3 with RW. Pt declined to attempt ambulation on today. Pt stated she has caregivers that assist as needed. If pt decides to d/c home, recommend HHPT and 24 hour supervision.    Follow Up Recommendations SNF;(unless pt will have appropriate level of care at home/facility)    Equipment Recommendations  Rolling walker with 5" wheels (if she does not have one)    Recommendations for Other Services       Precautions / Restrictions Precautions Precautions: Fall Restrictions Weight Bearing Restrictions: No      Mobility  Bed Mobility Overal bed mobility: Needs Assistance Bed Mobility: Supine to Sit     Supine to sit: HOB elevated;Min guard     General bed mobility comments: close guard for safety, increased time. PT c/o bil LEs "will not be still". Sat EOB and observed bil LEs continuously making tap dancing like movements that pt stated she could not control  Transfers Overall transfer level: Needs assistance Equipment used: Rolling walker (2 wheeled) Transfers: Sit to/from [employer] Sit to Stand: Min assist Stand pivot transfers: Min assist       General transfer comment: Sit to stand x3 with RW, STand pivot x3 with RW. Assist to rise, stabilize.   Ambulation/Gait             General Gait Details: NT-pt declined  Stairs            Wheelchair Mobility    Modified Rankin (Stroke Patients Only)       Balance Overall balance assessment: Needs assistance         Standing balance support: During functional activity Standing balance-Leahy Scale: Poor                               Pertinent Vitals/Pain Pain Assessment:  Faces Faces Pain Scale: Hurts little more Pain Location: shoulders, back Pain Intervention(s): Limited activity within patient's tolerance;Repositioned    Home Living Family/patient expects to be discharged to:: Unsure               Home Equipment: Gilmer Mor - single point      Prior Function Level of Independence: Needs assistance         Comments: unsure of PLOF info/home environment info     Hand Dominance        Extremity/Trunk Assessment   Upper Extremity Assessment: Generalized weakness           Lower Extremity Assessment: Generalized weakness      Cervical / Trunk Assessment: Kyphotic  Communication   Communication: No difficulties  Cognition Arousal/Alertness: Awake/alert Behavior During Therapy: WFL for tasks assessed/performed Overall Cognitive Status: No family/caregiver present to determine baseline cognitive functioning                      General Comments      Exercises        Assessment/Plan    PT Assessment Patient needs continued PT services  PT Diagnosis Difficulty walking;Generalized weakness   PT Problem List Decreased balance;Decreased activity tolerance;Decreased strength;Decreased mobility;Decreased knowledge of use of DME  PT Treatment Interventions DME instruction;Gait training;Functional mobility training;Therapeutic activities;Patient/family education;Balance  training;Therapeutic exercise   PT Goals (Current goals can be found in the Care Plan section) Acute Rehab PT Goals Patient Stated Goal: none stated PT Goal Formulation: With patient Time For Goal Achievement: 12/14/15 Potential to Achieve Goals: Fair    Frequency Min 3X/week   Barriers to discharge        Co-evaluation               End of Session   Activity Tolerance: Patient limited by fatigue Patient left: in chair;with call bell/phone within reach;with chair alarm set           Time: [id number] PT Time Calculation (min) (ACUTE  ONLY): 21 min   Charges:   PT Evaluation $PT Eval Low Complexity: 1 Procedure     PT G Codes:        Rebeca AlertJannie Alyn Jurney, MPT Pager: [phone]

## 2015-11-30 NOTE — H&P (Signed)
[facility] History and Physical  Lysbeth PennerMary Doering WGN:[id number]:[id number] DOB: August 31, 1927 DOA: 11/29/2015  Referring physician: ED  PCP: Herschel SenegalHABER, MICHELE, MD   Chief Complaint: Shortness of breath  HPI:  Brooke Curry is a 82 year old female with a past medical history significant for COPD on 3 liters of nasal cannula oxygen, atrial fibrillation, s/p pacemaker; who presents with complaints of progressively worsening shortness. Patient notes that symptoms have been ongoing for months. She notes having acute symptoms off and on all day including a productive cough with reports of chest pain. Coughing makes chest pain worse. Patient reports symptoms initially felt like gas, but were not relieved with belching. EMS had been called and the patient was given a continuous nebulizer treatment. Patient was still tachycardic on arrival to the emergency department. Due to patient's persistent tachycardia a d-dimer was ordered and found to be elevated.  CT angiogram was performed and showed signs of dense right lower lobe consolidation concerning for the possibility of pneumonia. Patient notes that she's been diagnosed at least twice since September 2016 with pneumonia. She has had her pneumonia vaccine and doesn't understand why she keeps getting pneumonia  Review of Systems  Constitutional: Positive for weight loss and malaise/fatigue. Negative for fever, chills and diaphoresis.  HENT: Negative for ear discharge and ear pain.   Eyes: Negative for photophobia and pain.  Respiratory: Positive for cough, sputum production and shortness of breath. Negative for hemoptysis.   Cardiovascular: Positive for chest pain. Negative for claudication.  Gastrointestinal: Negative for vomiting and abdominal pain.  Genitourinary: Negative for urgency and frequency.  Musculoskeletal: Positive for joint pain. Negative for falls.  Skin: Negative for itching and rash.  Neurological: Positive for weakness. Negative for speech change and  focal weakness.  Endo/Heme/Allergies: Negative for environmental allergies and polydipsia.  Psychiatric/Behavioral: Negative for hallucinations and substance abuse.      Past Medical History  Diagnosis Date  . Pacemaker   . A-fib (HCC)   . Hypertension      Past Surgical History  Procedure Laterality Date  . Phrenic nerve pacemaker implantation    . Laparoscopic hysterectomy    . Cholecystectomy    . Pacemaker placement  2010  . Hernia repair        Social History:  reports that she has quit smoking. She does not have any smokeless tobacco history on file. She reports that she drinks alcohol. She reports that she uses illicit drugs. Where does patient live--SNF  No Known Allergies  Family History  Problem Relation Age of Onset  . Heart disease Father   . Cancer Mother   . Cancer Brother   . Heart disease Brother         Prior to Admission medications   Medication Sig Start Date End Date Taking? Authorizing Provider  albuterol (PROVENTIL) (2.5 MG/3ML) 0.083% nebulizer solution Take 3 mLs (2.5 mg total) by nebulization every 4 (four) hours as needed for wheezing. Patient taking differently: Take 2.5 mg by nebulization 4 (four) times daily.  11/04/15  Yes Arvilla Marketatherine Lauren Wallace, DO  apixaban (ELIQUIS) 5 MG TABS tablet Take 2 tablets (10 mg total) by mouth 2 (two) times daily. Through 12/13. Then, taken 1 tablet (5 mg) by mouth 2 (two) times daily. Patient taking differently: Take 5 mg by mouth 2 (two) times daily.  11/04/15  Yes Arvilla Marketatherine Lauren Wallace, DO  cholecalciferol (VITAMIN D) 1000 UNITS tablet Take 1,000 Units by mouth daily.   Yes Historical Provider, MD  diltiazem (CARDIZEM SR) 60  MG 12 hr capsule Take 60 mg by mouth 2 (two) times daily.   Yes Historical Provider, MD  diphenhydrAMINE (BENADRYL) 25 MG tablet Take 25 mg by mouth at bedtime.   Yes Historical Provider, MD  ENSURE (ENSURE) Take 237 mLs by mouth 4 (four) times daily.   Yes Historical Provider, MD   guaiFENesin (MUCINEX) 600 MG 12 hr tablet Take 600 mg by mouth 2 (two) times daily.   Yes Historical Provider, MD  LORazepam (ATIVAN) 0.5 MG tablet Take 0.5 mg by mouth every 8 (eight) hours as needed for anxiety.    Yes Historical Provider, MD  magnesium hydroxide (MILK OF MAGNESIA) 400 MG/5ML suspension Take 60 mLs by mouth daily as needed for mild constipation.   Yes Historical Provider, MD  Melatonin 5 MG TABS Take 5 mg by mouth at bedtime.   Yes Historical Provider, MD  mirtazapine (REMERON) 7.5 MG tablet Take 3.75 mg by mouth at bedtime.   Yes Historical Provider, MD  omeprazole (PRILOSEC) 20 MG capsule Take 20 mg by mouth daily.   Yes Historical Provider, MD  ALPRAZolam Prudy Feeler) 0.25 MG tablet Take 0.25 mg by mouth at bedtime as needed for sleep.    Historical Provider, MD  ondansetron (ZOFRAN) 4 MG tablet Take 4 mg by mouth every 8 (eight) hours as needed for nausea or vomiting.    Historical Provider, MD  polyethylene glycol (MIRALAX / GLYCOLAX) packet Take 17 g by mouth daily as needed for mild constipation.     Historical Provider, MD     Physical Exam: Filed Vitals:   11/30/15 0200 11/30/15 0230 11/30/15 0300 11/30/15 0400  BP: 110/58 120/63 108/53 113/63  Pulse: 89 88 91 89  Temp:      TempSrc:      Resp: 11 22 23 14   SpO2: 95% 94% 95% 96%     Constitutional: Vital signs reviewed. Patient is a elderly female appears in no acute distress alert and oriented 3. Head: Normocephalic and atraumatic  Ear: TM normal bilaterally  Mouth: no erythema or exudates, MMM  Eyes: PERRL, EOMI, conjunctivae normal, No scleral icterus.  Neck: Supple, Trachea midline normal ROM, No JVD, mass, thyromegaly, or carotid bruit present.  Cardiovascular: RRR, S1 normal, S2 normal, no MRG, pulses symmetric and intact bilaterally  Pulmonary/Chest: Clear to auscultation, no wheezes, Rales, or rhonchi. Deep inspirations causes the patient to cough.  Abdominal: Soft. Non-tender, non-distended, bowel  sounds are normal, no masses, organomegaly, or guarding present.  GU: no CVA tenderness Musculoskeletal: No joint deformities, erythema, or stiffness, ROM full and no nontender Ext: no edema and no cyanosis, pulses palpable bilaterally (DP and PT)  Hematology: no cervical, inginal, or axillary adenopathy.  Neurological: A&O x3, Strenght is normal and symmetric bilaterally, cranial nerve II-XII are grossly intact, no focal motor deficit, sensory intact to light touch bilaterally.  Skin: Warm, dry and intact. No rash, cyanosis, or clubbing.  Psychiatric: Normal mood and affect. speech and behavior is normal. Judgment and thought content normal. Cognition and memory are normal.      Data Review   Micro Results No results found for this or any previous visit (from the past 240 hour(s)).  Radiology Reports Ct Angio Chest Pe W/cm &/or Wo Cm  11/30/2015  CLINICAL DATA:  Acute onset of shortness of breath. Elevated D-dimer. Initial encounter. EXAM: CT ANGIOGRAPHY CHEST WITH CONTRAST TECHNIQUE: Multidetector CT imaging of the chest was performed using the standard protocol during bolus administration of intravenous contrast. Multiplanar CT image  reconstructions and MIPs were obtained to evaluate the vascular anatomy. CONTRAST:  80mL OMNIPAQUE IOHEXOL 350 MG/ML SOLN COMPARISON:  Chest radiograph performed 11/28/2014, and CTA of the chest performed 11/01/2015 FINDINGS: There is no evidence of pulmonary embolus. Dense opacity is noted at the right lower lobe, concerning for pneumonia. Prominent scarring and thickened cavities are noted at the left lung apex, reflecting sequelae of atypical infection. Underlying diffuse emphysematous change is noted bilaterally, most prominent at the right upper lobe. There is no evidence of pleural effusion or pneumothorax. No masses are identified; no abnormal focal contrast enhancement is seen. The ascending thoracic aorta is borderline normal in caliber. A left-sided  pacemaker is noted, with leads ending at the right atrium and right ventricle. No pericardial effusion is identified. The great vessels are grossly unremarkable. No mediastinal lymphadenopathy is appreciated. No axillary lymphadenopathy is seen. The thyroid gland is unremarkable in appearance. The visualized portions of the liver and spleen are unremarkable. No acute osseous abnormalities are seen. Chronic compression deformities are noted at vertebral bodies T5, T6 and T7, most prominent at T7. Review of the MIP images confirms the above findings. IMPRESSION: 1. No evidence of pulmonary embolus. 2. Dense opacity at the right lower lobe, concerning for pneumonia. 3. Prominent scarring and thickened cavities at the left lung apex, reflecting sequelae of atypical infection. Underlying diffuse emphysematous change noted bilaterally, most prominent at the right upper lobe. 4. Chronic compression deformities at vertebral bodies T5, T6 and T7. Electronically Signed   By: Roanna Raider M.D.   On: 11/30/2015 01:50   Ct Angio Chest Pe W/cm &/or Wo Cm  11/02/2015  CLINICAL DATA:  82 year old female with worsening shortness of breath EXAM: CT ANGIOGRAPHY CHEST WITH CONTRAST TECHNIQUE: Multidetector CT imaging of the chest was performed using the standard protocol during bolus administration of intravenous contrast. Multiplanar CT image reconstructions and MIPs were obtained to evaluate the vascular anatomy. CONTRAST:  80mL OMNIPAQUE IOHEXOL 350 MG/ML SOLN COMPARISON:  Chest radiograph dated 11/01/2015 and 10/15/2015 FINDINGS: There is severe bullous emphysematous changes of the lungs. There is a 2.9 x 4.8 cm cavitary lesion in the left upper lobe with thickened wall and internal debris which may represent an infected bulla. Other infectious process are not excluded. The cavitary neoplasm is less likely. There is extension of airspace disease from the left apical region to the left hilum. A focal area of consolidation is  noted at the right lung base, likely infectious in etiology. Underlying mass is not excluded. Clinical correlation and follow-up resolution is recommended. There are fibrotic changes and scarring of the left upper lobe with volume loss. There is no pleural effusion or pneumothorax. Bold The central airways are patent. There is atherosclerotic calcification of the thoracic aorta. There is dilatation of the main pulmonary trunk indicative of a degree of pulmonary hypertension. There is a small filling defect in the subsegmental left lower lobe pulmonary artery branch (series 406 image 154). Small right upper lobe pulmonary artery nonocclusive/ peripheral filling defect may represent chronic thrombus/scarring. There is no cardiomegaly or pericardial effusion. There is coronary vascular calcification. Top-normal bilateral hilar lymph nodes. No mediastinal adenopathy. The thyroid gland is not well visualized. The esophagus is grossly unremarkable. There is no axillary adenopathy. Left pectoral pacemaker device. There is diffuse subcutaneous soft tissue stranding of the chest wall. There is extensive degenerative changes of the spine no acute fracture. Cholecystectomy. The visualized upper abdomen is grossly unremarkable. Review of the MIP images confirms the above findings.  IMPRESSION: Small left lower lobe subsegmental pulmonary artery branch embolus. Severe emphysema with left apical cavitary opacity, likely a bulla with superimposed pneumonia. Right lung base focal consolidation most compatible with pneumonia. Clinical correlation and follow-up recommended. Dilated main pulmonary trunk compatible with a degree of pulmonary hypertension. Critical Value/emergent results were called by telephone at the time of interpretation on 11/02/2015 at 12:41 am to nurse Le who verbally acknowledged these results. Electronically Signed   By: Elgie Collard M.D.   On: 11/02/2015 00:42   Dg Chest Port 1 View  11/29/2015  CLINICAL  DATA:  Sob increased today. Patient used dual nebulizer 4 times at home, no relief. Hx COPD, pna, AFib, htn EXAM: PORTABLE CHEST 1 VIEW COMPARISON:  11/01/2015 CT and plain film FINDINGS: Lungs are hyperinflated. Again noted is complex cavitary lesion within the left lung apex. Significant emphysematous changes most notable at the right lung apex. Chronic lung markings at the medial right lung base. No focal consolidations or pleural effusions are identified. Left-sided AICD leads to right atrium and right ventricle. IMPRESSION: 1. Chronic changes including left apical cavitary lesion and emphysematous changes. 2.  No evidence for acute cardiopulmonary abnormality. Electronically Signed   By: Norva Pavlov M.D.   On: 11/29/2015 23:01   Dg Chest Port 1 View  11/01/2015  CLINICAL DATA:  Chest and back pain x 1 week w/ sob. pna in Sept 2016. Bronchitis 2 wks. No fever. No n/v. Htn. No diab. Ex smoker. No hx mi or stroke. No hx tb. No jaw, neck, or arm pain. EXAM: PORTABLE CHEST - 1 VIEW COMPARISON:  10/15/2015 FINDINGS: Progressive airspace consolidation in the left upper lobe peripherally. Some increase in airspace opacities at the right lung base. Heart size normal. Atheromatous aorta. Left subclavian pacemaker stable. Persistent blunting of lateral costophrenic angles as before. No pneumothorax. Visualized skeletal structures are unremarkable. IMPRESSION: 1. Worsening airspace disease in the left upper lobe and right lower lobes since previous exam. Electronically Signed   By: Corlis Leak M.D.   On: 11/01/2015 17:03     CBC  Recent Labs Lab 11/29/15 2228  WBC 3.5*  HGB 12.5  HCT 38.9  PLT 167  MCV 99.0  MCH 31.8  MCHC 32.1  RDW 16.5*  LYMPHSABS 0.2*  MONOABS 0.0*  EOSABS 0.0  BASOSABS 0.0    Chemistries   Recent Labs Lab 11/29/15 2228  NA 138  K 3.9  CL 106  CO2 22  GLUCOSE 331*  BUN 21*  CREATININE 0.79  CALCIUM 8.8*    ------------------------------------------------------------------------------------------------------------------ CrCl cannot be calculated (Unknown ideal weight.). ------------------------------------------------------------------------------------------------------------------ No results for input(s): HGBA1C in the last 72 hours. ------------------------------------------------------------------------------------------------------------------ No results for input(s): CHOL, HDL, LDLCALC, TRIG, CHOLHDL, LDLDIRECT in the last 72 hours. ------------------------------------------------------------------------------------------------------------------ No results for input(s): TSH, T4TOTAL, T3FREE, THYROIDAB in the last 72 hours.  Invalid input(s): FREET3 ------------------------------------------------------------------------------------------------------------------ No results for input(s): VITAMINB12, FOLATE, FERRITIN, TIBC, IRON, RETICCTPCT in the last 72 hours.  Coagulation profile No results for input(s): INR, PROTIME in the last 168 hours.   Recent Labs  11/29/15 2228  DDIMER 1.75*    Cardiac Enzymes  Recent Labs Lab 11/29/15 2228  TROPONINI <0.03   ------------------------------------------------------------------------------------------------------------------ Invalid input(s): POCBNP   CBG: No results for input(s): GLUCAP in the last 168 hours.     EKG: Independently reviewed. Paced rhythm  Assessment/Plan Principal Problem:  Suspected HCAP (healthcare-associated pneumonia): Patient presenting from nursing facility with complaints of shortness of breath. Found to have elevated d-dimer or CT angiogram  shows no signs of acute clot, but does show right lower lobe consolidation consistent with pneumonia. - Admit to MedSurg bed - Empiric antibiotics of vancomycin and Zosyn - Sputum cultures - Mucinex - Duonebs     Atrial fibrillation: Chronic. Chadsvasc  4 -Continue Eliquis and Cardizem   Anxiety -Continue home Xanax  S/P placement of cardiac pacemaker: Stable   Gerd - Pharmacy substitution of omeprazole for Protonix   Code Status:   full Family Communication: bedside Disposition Plan: admit   Total time spent 55 minutes.Greater than 50% of this time was spent in counseling, explanation of diagnosis, planning of further management, and coordination of care  BRITLEY GASHI [facility] Pager [phone]  If 7PM-7AM, please contact night-coverage [email or web address] Password [email or web address] 11/30/2015, 5:58 AM

## 2015-11-30 NOTE — Progress Notes (Signed)
ANTIBIOTIC CONSULT NOTE - INITIAL  Pharmacy Consult for Vancomycin, antibiotic renal dose adjustment Indication: pneumonia  No Known Allergies  Patient Measurements: Height: 5' (152.4 cm) Weight: 98 lb 11.2 oz (44.77 kg) IBW/kg (Calculated) : 45.5 Adjusted Body Weight:   Vital Signs: Temp: 97.9 F (36.6 C) (01/03 0652) Temp Source: Oral (01/03 0652) BP: 143/63 mmHg (01/03 0652) Pulse Rate: 94 (01/03 0652) Intake/Output from previous day:   Intake/Output from this shift:    Labs:  Recent Labs  11/29/15 2228  WBC 3.5*  HGB 12.5  PLT 167  CREATININE 0.79   Estimated Creatinine Clearance: 35.7 mL/min (by C-G formula based on Cr of 0.79). No results for input(s): VANCOTROUGH, VANCOPEAK, VANCORANDOM, GENTTROUGH, GENTPEAK, GENTRANDOM, TOBRATROUGH, TOBRAPEAK, TOBRARND, AMIKACINPEAK, AMIKACINTROU, AMIKACIN in the last 72 hours.   Microbiology: Recent Results (from the past 720 hour(s))  Culture, blood (routine x 2)     Status: None   Collection Time: 11/01/15  7:24 PM  Result Value Ref Range Status   Specimen Description BLOOD RIGHT HAND  Final   Special Requests BOTTLES DRAWN AEROBIC AND ANAEROBIC 5CC  Final   Culture NO GROWTH 5 DAYS  Final   Report Status 11/06/2015 FINAL  Final  Culture, blood (routine x 2)     Status: None   Collection Time: 11/01/15  7:29 PM  Result Value Ref Range Status   Specimen Description BLOOD LEFT ARM  Final   Special Requests BOTTLES DRAWN AEROBIC AND ANAEROBIC 5CC  Final   Culture NO GROWTH 5 DAYS  Final   Report Status 11/06/2015 FINAL  Final    Medical History: Past Medical History  Diagnosis Date  . Pacemaker   . A-fib (HCC)   . Hypertension     Assessment: Brooke Curry from nursing facility with COPD on 3L O2 PTA, afib s/p pacemaker presents with shortness of breath and productive cough with chest pain.  D-dimer elevated.  CT angiogram was performed and showed signs of dense right lower lobe consolidation concerning for the  possibility of pneumonia, no acute clots. Patient notes that she's been diagnosed at least twice since September 2016 with pneumonia; however, during most recent admission last month, antibiotics were discontinued per discharge summary 11/05/15 after discussions with patient's PCP who "mentioned that imaging has shown the saw type of scaring or fluid present in the same location chronically. She felt that a PNA was unlikely and desired antibiotics to be discontinued.".  Admitted for treatment of HCAP.  Patient received Vancomycin 1g IV x 1 and Zosyn 3.375g x 1 in the ED.  Vancomycin and Cefepime ordered on admission and pharmacy consulted to assist with dosing.  Today, 11/30/2015: - WBC 3.5 - Afebrile - SCr 0.79, CrCl~35 ml/min  Antimicrobials this admission: 1/3 Zosyn x 1 1/3 Vancomcyin >>  1/3 Cefepime >>   Levels/dose changes this admission: -  Microbiology Results: Sputum: ordered Legionella/Strep pneumo urinary antigens: ordered  Goal of Therapy:  Vancomycin trough level 15-20 mcg/ml  Doses adjusted per renal function Eradication of infection  Plan:  1.  Vancomycin 750 mg IV q24h.  First dose tomorrow. 2.  Cefepime 1g IV q24h. 3.  F/u SCr, trough levels as indicated, clinical course.  Clance Bollunyon, Suprena Travaglini 11/30/2015,7:54 AM

## 2015-12-01 DIAGNOSIS — I480 Paroxysmal atrial fibrillation: Secondary | ICD-10-CM

## 2015-12-01 LAB — GLUCOSE, CAPILLARY
GLUCOSE-CAPILLARY: 110 mg/dL — AB (ref 65–99)
GLUCOSE-CAPILLARY: 72 mg/dL (ref 65–99)
GLUCOSE-CAPILLARY: 79 mg/dL (ref 65–99)
GLUCOSE-CAPILLARY: 84 mg/dL (ref 65–99)
GLUCOSE-CAPILLARY: 85 mg/dL (ref 65–99)
Glucose-Capillary: 104 mg/dL — ABNORMAL HIGH (ref 65–99)
Glucose-Capillary: 128 mg/dL — ABNORMAL HIGH (ref 65–99)

## 2015-12-01 LAB — BASIC METABOLIC PANEL
Anion gap: 7 (ref 5–15)
BUN: 22 mg/dL — AB (ref 6–20)
CO2: 29 mmol/L (ref 22–32)
CREATININE: 0.8 mg/dL (ref 0.44–1.00)
Calcium: 8.9 mg/dL (ref 8.9–10.3)
Chloride: 106 mmol/L (ref 101–111)
GFR calc Af Amer: >60 mL/min (ref >60)
GFR calc non Af Amer: >60 mL/min (ref >60)
GLUCOSE: 96 mg/dL (ref 65–99)
Potassium: 3.9 mmol/L (ref 3.5–5.1)
SODIUM: 142 mmol/L (ref 135–145)

## 2015-12-01 LAB — CBC
HCT: 36.3 % (ref 36.0–46.0)
HEMOGLOBIN: 11.2 g/dL — AB (ref 12.0–15.0)
MCH: 31.3 pg (ref 26.0–34.0)
MCHC: 30.9 g/dL (ref 30.0–36.0)
MCV: 101.4 fL — AB (ref 78.0–100.0)
PLATELETS: 140 10*3/uL — AB (ref 150–400)
RBC: 3.58 MIL/uL — ABNORMAL LOW (ref 3.87–5.11)
RDW: 16.5 % — ABNORMAL HIGH (ref 11.5–15.5)
WBC: 5.8 10*3/uL (ref 4.0–10.5)

## 2015-12-01 LAB — STREP PNEUMONIAE URINARY ANTIGEN: STREP PNEUMO URINARY ANTIGEN: NEGATIVE

## 2015-12-01 LAB — HEMOGLOBIN A1C
HEMOGLOBIN A1C: 5.4 % (ref 4.8–5.6)
Mean Plasma Glucose: 108 mg/dL

## 2015-12-01 MED ORDER — BENZONATATE 100 MG PO CAPS
100.0000 mg | ORAL_CAPSULE | Freq: Three times a day (TID) | ORAL | Status: DC | PRN
  Administered 2015-12-01 – 2015-12-03 (×6): 100 mg via ORAL
  Filled 2015-12-01 (×7): qty 1

## 2015-12-01 MED ORDER — IPRATROPIUM-ALBUTEROL 0.5-2.5 (3) MG/3ML IN SOLN
3.0000 mL | Freq: Three times a day (TID) | RESPIRATORY_TRACT | Status: DC
  Administered 2015-12-01 – 2015-12-03 (×8): 3 mL via RESPIRATORY_TRACT
  Filled 2015-12-01 (×8): qty 3

## 2015-12-01 NOTE — Progress Notes (Addendum)
[facility] PROGRESS NOTE  Brooke Curry ZOX:[id number] DOB: Mar 27, 1927 DOA: 11/29/2015 PCP: [name], MD  HPI/Brief narrative 82 year old female with a past medical history significant for COPD on 3 liters of nasal cannula oxygen, atrial fibrillation, s/p pacemaker; who presents with complaints of progressively worsening shortness. Pt was last admitted in 12/16 where she was briefly treated for possible PNA, but later thought to have chronic CXR findings and abx stopped. Pt admitted for worsening PNA  Assessment/Plan: Suspected HCAP (healthcare-associated pneumonia): -Patient presenting from nursing facility with complaints of shortness of breath.  -Found to have elevated d-dimer however CT angiogram without signs of acute clot. Instead, findings of right lower lobe consolidation consistent with pneumonia. - Continued on empiric antibiotics of vancomycin and Zosyn - Follow sputum cultures - Mucinex - Duonebs    Atrial fibrillation: Chronic. Chadsvasc 4 -Continue Eliquis and Cardizem  Anxiety -Continue home Xanax  S/P placement of cardiac pacemaker: Stable   Gerd - Pharmacy substitution of omeprazole for Protonix   COPD - On baseline 3LNC - significant emphysematous changes on CT per my own read - Suspect pt would benefit from Hospice level care given severity of COPD and number of recent re-admissions. Son at bedside agrees with this. At this point, pt is agreeable to discussing Goals of Care with Palliative Care service. Have consulted Palliative Care.  Code Status: Full Family Communication: Pt in room, family at bedside Disposition Plan: D/c when off IV abx, dispo plans pending Palliative Care input   Consultants:  Palliative Care  Procedures:    Antibiotics: Anti-infectives    Start     Dose/Rate Route Frequency Ordered Stop   12/01/15 0600  vancomycin (VANCOCIN) IVPB 750 mg/150 ml premix     750 mg 150 mL/hr over 60 Minutes Intravenous Every 24  hours 11/30/15 0812     11/30/15 0800  ceFEPIme (MAXIPIME) 1 g in dextrose 5 % 50 mL IVPB     1 g 100 mL/hr over 30 Minutes Intravenous Every 24 hours 11/30/15 0726 12/07/15 0959   11/30/15 0415  vancomycin (VANCOCIN) IVPB 1000 mg/200 mL premix     1,000 mg 200 mL/hr over 60 Minutes Intravenous  Once 11/30/15 0413 11/30/15 0554   11/30/15 0415  piperacillin-tazobactam (ZOSYN) IVPB 3.375 g     3.375 g 100 mL/hr over 30 Minutes Intravenous  Once 11/30/15 0413 11/30/15 0520       HPI/Subjective: Feels somewhat better today  Objective: Filed Vitals:   11/30/15 2036 11/30/15 2039 12/01/15 0417 12/01/15 0845  BP:  106/58 104/74   Pulse:  80 80   Temp:  97.7 F (36.5 C) 97.9 F (36.6 C)   TempSrc:  Oral Oral   Resp:  18 16   Height:      Weight:      SpO2: 92% 96% 99% 96%    Intake/Output Summary (Last 24 hours) at 12/01/15 1424 Last data filed at 12/01/15 0900  Gross per 24 hour  Intake   1206 ml  Output    650 ml  Net    556 ml   Filed Weights   11/30/15 0652  Weight: 44.77 kg (98 lb 11.2 oz)    Exam:   General:  Awake, in nad  Cardiovascular: regular, s1, s2  Respiratory: Slightly increased resp effort, decreased breath soundsB  Abdomen: soft, nondistended  Musculoskeletal: perfused, no clubbing   Data Reviewed: Basic Metabolic Panel:  Recent Labs Lab 11/29/15 2228 12/01/15 0442  NA 138 142  K 3.9 3.9  CL 106 106  CO2 22 29  GLUCOSE 331* 96  BUN 21* 22*  CREATININE 0.79 0.80  CALCIUM 8.8* 8.9   Liver Function Tests: No results for input(s): AST, ALT, ALKPHOS, BILITOT, PROT, ALBUMIN in the last 168 hours. No results for input(s): LIPASE, AMYLASE in the last 168 hours. No results for input(s): AMMONIA in the last 168 hours. CBC:  Recent Labs Lab 11/29/15 2228 12/01/15 0442  WBC 3.5* 5.8  NEUTROABS 3.2  --   HGB 12.5 11.2*  HCT 38.9 36.3  MCV 99.0 101.4*  PLT 167 140*   Cardiac Enzymes:  Recent Labs Lab 11/29/15 2228  TROPONINI  <0.03   BNP (last 3 results)  Recent Labs  11/01/15 1755  BNP 320.5*    ProBNP (last 3 results) No results for input(s): PROBNP in the last 8760 hours.  CBG:  Recent Labs Lab 11/30/15 1951 12/01/15 0007 12/01/15 0414 12/01/15 0759 12/01/15 1149  GLUCAP 115* 110* 104* 79 128*    No results found for this or any previous visit (from the past 240 hour(s)).   Studies: Ct Angio Chest Pe W/cm &/or Wo Cm  11/30/2015  CLINICAL DATA:  Acute onset of shortness of breath. Elevated D-dimer. Initial encounter. EXAM: CT ANGIOGRAPHY CHEST WITH CONTRAST TECHNIQUE: Multidetector CT imaging of the chest was performed using the standard protocol during bolus administration of intravenous contrast. Multiplanar CT image reconstructions and MIPs were obtained to evaluate the vascular anatomy. CONTRAST:  80mL OMNIPAQUE IOHEXOL 350 MG/ML SOLN COMPARISON:  Chest radiograph performed 11/28/2014, and CTA of the chest performed 11/01/2015 FINDINGS: There is no evidence of pulmonary embolus. Dense opacity is noted at the right lower lobe, concerning for pneumonia. Prominent scarring and thickened cavities are noted at the left lung apex, reflecting sequelae of atypical infection. Underlying diffuse emphysematous change is noted bilaterally, most prominent at the right upper lobe. There is no evidence of pleural effusion or pneumothorax. No masses are identified; no abnormal focal contrast enhancement is seen. The ascending thoracic aorta is borderline normal in caliber. A left-sided pacemaker is noted, with leads ending at the right atrium and right ventricle. No pericardial effusion is identified. The great vessels are grossly unremarkable. No mediastinal lymphadenopathy is appreciated. No axillary lymphadenopathy is seen. The thyroid gland is unremarkable in appearance. The visualized portions of the liver and spleen are unremarkable. No acute osseous abnormalities are seen. Chronic compression deformities are  noted at vertebral bodies T5, T6 and T7, most prominent at T7. Review of the MIP images confirms the above findings. IMPRESSION: 1. No evidence of pulmonary embolus. 2. Dense opacity at the right lower lobe, concerning for pneumonia. 3. Prominent scarring and thickened cavities at the left lung apex, reflecting sequelae of atypical infection. Underlying diffuse emphysematous change noted bilaterally, most prominent at the right upper lobe. 4. Chronic compression deformities at vertebral bodies T5, T6 and T7. Electronically Signed   By: Roanna Raider M.D.   On: 11/30/2015 01:50   Dg Chest Port 1 View  11/29/2015  CLINICAL DATA:  Sob increased today. Patient used dual nebulizer 4 times at home, no relief. Hx COPD, pna, AFib, htn EXAM: PORTABLE CHEST 1 VIEW COMPARISON:  11/01/2015 CT and plain film FINDINGS: Lungs are hyperinflated. Again noted is complex cavitary lesion within the left lung apex. Significant emphysematous changes most notable at the right lung apex. Chronic lung markings at the medial right lung base. No focal consolidations or pleural effusions are identified. Left-sided AICD leads to right atrium and  right ventricle. IMPRESSION: 1. Chronic changes including left apical cavitary lesion and emphysematous changes. 2.  No evidence for acute cardiopulmonary abnormality. Electronically Signed   By: Norva PavlovElizabeth  Brown M.D.   On: 11/29/2015 23:01    Scheduled Meds: . apixaban  5 mg Oral BID  . ceFEPime (MAXIPIME) IV  1 g Intravenous Q24H  . cholecalciferol  1,000 Units Oral Daily  . diltiazem  60 mg Oral BID  . feeding supplement (ENSURE ENLIVE)  237 mL Oral QID  . guaiFENesin  600 mg Oral BID  . insulin aspart  0-9 Units Subcutaneous 6 times per day  . ipratropium-albuterol  3 mL Nebulization TID  . mirtazapine  3.75 mg Oral QHS  . pantoprazole  40 mg Oral Daily  . vancomycin  750 mg Intravenous Q24H   Continuous Infusions:   Principal Problem:   HCAP (healthcare-associated  pneumonia) Active Problems:   Atrial fibrillation (HCC)   S/P placement of cardiac pacemaker   GERD (gastroesophageal reflux disease)   Dyson Sevey K  [facility] Pager [phone]. If 7PM-7AM, please contact night-coverage at [email or web address], password [email or web address] 12/01/2015, 2:24 PM  LOS: 1 day

## 2015-12-01 NOTE — NC FL2 (Signed)
[place] MEDICAID FL2 LEVEL OF CARE SCREENING TOOL     IDENTIFICATION  Patient Name: Brooke PennerMary Metallo Birthdate: 11-29-1926 Sex: female Admission Date (Current Location): 11/29/2015  [facility] [place]   Facility and Address:  [facility],  [address], [place] [id number]      Provider Number: [id number]  Attending Physician Name and Address:  Jerald KiefStephen K Chiu, MD  Relative Name and Phone Number:       Current Level of Care: Hospital Recommended Level of Care: Skilled Nursing Facility Prior Approval Number:    Date Approved/Denied:   PASRR Number: [id number]  Discharge Plan: SNF    Current Diagnoses: Patient Active Problem List   Diagnosis Date Noted  . HCAP (healthcare-associated pneumonia) 11/30/2015  . S/P placement of cardiac pacemaker 11/30/2015  . GERD (gastroesophageal reflux disease) 11/30/2015  . HAP (hospital-acquired pneumonia)   . Pulmonary embolism without acute cor pulmonale (HCC)   . Dyspnea 11/01/2015  . Essential hypertension 08/26/2015  . Sick sinus syndrome (HCC) 08/26/2015  . Atrial fibrillation (HCC) 08/26/2015    Orientation RESPIRATION BLADDER Height & Weight    Self, Time, Situation, Place  O2 (3L) Continent 5' (152.4 cm) 98 lbs.  BEHAVIORAL SYMPTOMS/MOOD NEUROLOGICAL BOWEL NUTRITION STATUS   (no behaviors)  (No Seizures) Continent Diet (heart healthy/carb modified )  AMBULATORY STATUS COMMUNICATION OF NEEDS Skin   Limited Assist Verbally Normal                       Personal Care Assistance Level of Assistance  Bathing, Feeding, Dressing Bathing Assistance: Limited assistance Feeding assistance: Independent Dressing Assistance: Limited assistance     Functional Limitations Info  Sight, Hearing, Speech Sight Info: Adequate Hearing Info: Adequate Speech Info: Adequate    SPECIAL CARE FACTORS FREQUENCY  PT (By licensed PT), OT (By licensed OT)     PT Frequency: 5 x a week OT Frequency: 5 x  a week            Contractures Contractures Info: Not present    Additional Factors Info  Code Status, Allergies, Insulin Sliding Scale, Psychotropic Code Status Info: FULL code status Allergies Info: No Known Allergies Psychotropic Info: Remeron Insulin Sliding Scale Info: 6 x a day       Current Medications (12/01/2015):  This is the current hospital active medication list Current Facility-Administered Medications  Medication Dose Route Frequency Provider Last Rate Last Dose  . apixaban (ELIQUIS) tablet 5 mg  5 mg Oral BID Clydie Braunondell A Dix, MD   5 mg at 12/01/15 0959  . benzonatate (TESSALON) capsule 100 mg  100 mg Oral TID PRN Jerald KiefStephen K Chiu, MD   100 mg at 12/01/15 1733  . ceFEPIme (MAXIPIME) 1 g in dextrose 5 % 50 mL IVPB  1 g Intravenous Q24H Clydie Braunondell A Behrmann, MD   1 g at 12/01/15 0959  . cholecalciferol (VITAMIN D) tablet 1,000 Units  1,000 Units Oral Daily Clydie Braunondell A Wargo, MD   1,000 Units at 12/01/15 0959  . diltiazem (CARDIZEM SR) 12 hr capsule 60 mg  60 mg Oral BID Clydie Braunondell A Dreibelbis, MD   60 mg at 12/01/15 0959  . feeding supplement (ENSURE ENLIVE) (ENSURE ENLIVE) liquid 237 mL  237 mL Oral QID Rondell A Katrinka BlazingSmith, MD   237 mL at 12/01/15 1400  . guaiFENesin (MUCINEX) 12 hr tablet 600 mg  600 mg Oral BID Clydie Braunondell A Lardner, MD   600 mg at 12/01/15 0959  .  insulin aspart (novoLOG) injection 0-9 Units  0-9 Units Subcutaneous 6 times per day Clydie Braun, MD   1 Units at 12/01/15 1234  . ipratropium-albuterol (DUONEB) 0.5-2.5 (3) MG/3ML nebulizer solution 3 mL  3 mL Nebulization Q2H PRN Rondell A Katrinka Blazing, MD      . ipratropium-albuterol (DUONEB) 0.5-2.5 (3) MG/3ML nebulizer solution 3 mL  3 mL Nebulization TID Osvaldo Shipper, MD   3 mL at 12/01/15 1447  . LORazepam (ATIVAN) tablet 0.5 mg  0.5 mg Oral Q8H PRN Clydie Braun, MD   0.5 mg at 11/30/15 2227  . magnesium hydroxide (MILK OF MAGNESIA) suspension 60 mL  60 mL Oral Daily PRN Clydie Braun, MD      . mirtazapine (REMERON) tablet  3.75 mg  3.75 mg Oral QHS Clydie Braun, MD   3.75 mg at 11/30/15 2217  . morphine 2 MG/ML injection 1-2 mg  1-2 mg Intravenous Q4H PRN Osvaldo Shipper, MD   2 mg at 12/01/15 1716  . pantoprazole (PROTONIX) EC tablet 40 mg  40 mg Oral Daily Clydie Braun, MD   40 mg at 12/01/15 0959  . polyethylene glycol (MIRALAX / GLYCOLAX) packet 17 g  17 g Oral Daily PRN Clydie Braun, MD      . vancomycin (VANCOCIN) IVPB 750 mg/150 ml premix  750 mg Intravenous Q24H Teressa Lower, RPH   750 mg at 12/01/15 1610     Discharge Medications: Please see discharge summary for a list of discharge medications.  Relevant Imaging Results:  Relevant Lab Results:   Additional Information SSN: [id number]  KIDD, SUZANNA A, LCSW

## 2015-12-01 NOTE — Clinical Social Work Note (Signed)
Clinical Social Work Assessment  Patient Details  Name: Brooke Curry MRN: [id number] Date of Birth: Dec 10, 1926  Date of referral:  12/01/15               Reason for consult:  Discharge Planning                Permission sought to share information with:  Family Supports Permission granted to share information::  Yes, Verbal Permission Granted  Name::     Day Greb  Agency::     Relationship::  spouse  Contact Information:  [phone]  Housing/Transportation Living arrangements for the past 2 months:  [place] of Information:  Patient, Adult Children Patient Interpreter Needed:  None Criminal Activity/Legal Involvement Pertinent to Current Situation/Hospitalization:  No - Comment as needed Significant Relationships:  Adult Children Lives with:  Facility Resident Do you feel safe going back to the place where you live?   (dependent on progress) Need for family participation in patient care:  Yes (Comment) (per pt request)  Care giving concerns:  Pt admitted from [facility]. Pt plans to see how she progresses to determine if she needs rehab vs can return to ALF. PMT GOC pending at this time.   Social Worker assessment / plan:  CSW received referral that pt admitted from [facility].   CSW met with pt at bedside. CSW introduced self and explained role. Pt confirmed she is a resident at [facility]. Pt reports that she was able to ambulate to the bathroom at ALF and at this time is only able to get to bedside commode. Pt reports that she would like to return to [facility] since she had made payment to facility for the month, but would be open to SNF level of care if ALF could not meet her needs. Pt discussed that she was recently at [facility] but is unsure if she has remaining days for Medicare and agreeable for CSW to obtain information from [facility] if pt has SNF days remaining. Pt reports that she has been struggling with  medical issues since September and originally is from [place], but currently in [place] at [place] as pt son lives in [place]. Pt provided permission for CSW to contact pt son.   CSW contacted pt son, [name] via telephone. Pt son reports that pt has been chronically ill and pt son does not feel that pt grasp that she is not near being able to live alone again as pt has a desire to return to [place]. CSW discussed with pt son CSW conversation with pt and discussed that CSW understands that Palliative is consulted to address goals of care. Pt son agreeable to following up after palliative consult and agreeable to CSW speaking with [facility] and [facility] to determine what facilities are able to offer.  CSW contacted [facility] and left message with resident care coordinator, Synthia Innocent and [place] sent information to [facility] inquiring about if pt has SNF days remaining. CSW to follow up with facilities tomorrow and follow up on recommendations from PMT [place] meeting.  CSW to continue to follow.     Employment status:  Retired Forensic scientist:  [employer] PT Recommendations:  [place] / Referral to community resources:  [place], Other (Comment Required) (will also discuss with [facility] if facility can continue to meet pt needs; PMT GOC )  Patient/Family's Response to care:  Pt alert and oriented x 4. Pt son supportive and actively involved  in pt care. Pt and pt son in agreement to explore options and meet with palliative and continue to work toward developing best plan for pt upon discharge.  Patient/Family's Understanding of and Emotional Response to Diagnosis, Current Treatment, and Prognosis:  Pt does not display great understanding in regard to the significance of her medical needs and pt son concerned about this and hopeful that pt will gain a better grasp of her medical condition and needs.  Emotional  Assessment Appearance:  Appears stated age Attitude/Demeanor/Rapport:  Other (pt appropriate) Affect (typically observed):  Appropriate Orientation:  Oriented to Self, Oriented to Place, Oriented to  Time, Oriented to Situation Alcohol / Substance use:  Not Applicable Psych involvement (Current and /or in the community):  No (Comment)  Discharge Needs  Concerns to be addressed:  Discharge Planning Concerns Readmission within the last 30 days:  No Current discharge risk:  Chronically ill Barriers to Discharge:  Continued Medical Work up   Alison Murray A, LCSW 12/01/2015, 5:56 PM  [phone]

## 2015-12-02 DIAGNOSIS — Z7189 Other specified counseling: Secondary | ICD-10-CM

## 2015-12-02 DIAGNOSIS — Z515 Encounter for palliative care: Secondary | ICD-10-CM

## 2015-12-02 DIAGNOSIS — R0600 Dyspnea, unspecified: Secondary | ICD-10-CM

## 2015-12-02 LAB — BASIC METABOLIC PANEL
Anion gap: 7 (ref 5–15)
BUN: 30 mg/dL — ABNORMAL HIGH (ref 6–20)
CALCIUM: 8.9 mg/dL (ref 8.9–10.3)
CO2: 30 mmol/L (ref 22–32)
CREATININE: 0.7 mg/dL (ref 0.44–1.00)
Chloride: 103 mmol/L (ref 101–111)
GFR calc non Af Amer: >60 mL/min (ref >60)
Glucose, Bld: 127 mg/dL — ABNORMAL HIGH (ref 65–99)
Potassium: 4 mmol/L (ref 3.5–5.1)
SODIUM: 140 mmol/L (ref 135–145)

## 2015-12-02 LAB — CBC
HEMATOCRIT: 40 % (ref 36.0–46.0)
Hemoglobin: 12.5 g/dL (ref 12.0–15.0)
MCH: 31.6 pg (ref 26.0–34.0)
MCHC: 31.3 g/dL (ref 30.0–36.0)
MCV: 101.3 fL — AB (ref 78.0–100.0)
Platelets: 157 10*3/uL (ref 150–400)
RBC: 3.95 MIL/uL (ref 3.87–5.11)
RDW: 16.4 % — AB (ref 11.5–15.5)
WBC: 5.8 10*3/uL (ref 4.0–10.5)

## 2015-12-02 LAB — GLUCOSE, CAPILLARY
GLUCOSE-CAPILLARY: 123 mg/dL — AB (ref 65–99)
GLUCOSE-CAPILLARY: 104 mg/dL — AB (ref 65–99)
GLUCOSE-CAPILLARY: 114 mg/dL — AB (ref 65–99)
Glucose-Capillary: 111 mg/dL — ABNORMAL HIGH (ref 65–99)
Glucose-Capillary: 155 mg/dL — ABNORMAL HIGH (ref 65–99)
Glucose-Capillary: 85 mg/dL (ref 65–99)

## 2015-12-02 LAB — LEGIONELLA ANTIGEN, URINE

## 2015-12-02 NOTE — Progress Notes (Signed)
Palliative Medicine Team consult was received.   I met briefly with Brooke Curry this morning.  Plan for a family meeting with her son, [name], this afternoon at 2:30pm.  If there are urgent needs or questions please call [phone]. Thank you for consulting out team to assist with this patients care.

## 2015-12-02 NOTE — Consult Note (Addendum)
Consultation Note Date: 12/02/2015   Patient Name: Brooke Curry  DOB: Mar 28, 1927  MRN: [id number]  Age / Sex: 82 y.o., female  PCP: Marylynn Pearson, MD Referring Physician: Donne Hazel, MD  Reason for Consultation: Disposition, Establishing goals of care and Psychosocial/spiritual support  Clinical Assessment/Narrative: Ms. Marines is an 82 year old female admitted for HCAP. She has a history of severe COPD and is chronically dependent on 3 L via nasal cannula. She has been living at [facility] prior to admission. She has had multiple hospitalizations with increasing frequency over the past several months related to her COPD.  She is originally from [place] recently moved the area to be closer to where her son lives. Palliative consulted for goals of care.  I met with Ms. Haston and her son, [name]. She reports the most important thing to her is "getting well" and having a strong desire to return to her prior home in [place] where she lived independently up until September of this year.  Both she and [name] admit that she has had significant decline in her health since that time. She was very active in the past, but has had a bed to chair existence since September and has progressed to being essentially bedbound.  I tried multiple approaches to develop shared goals on which we could work to develop a plan moving forward. She reported she did not find this to be helpful and became very agitated with any suggestion other than a full recovery back to her prior baseline in September of this year. See summary below.  Contacts/Participants in Discussion: Patient and her son, [name] Primary Decision Maker: Patient   Relationship to Patient self HCPOA: No. She would like to name her son, [name], as her HCPOA.  SUMMARY OF RECOMMENDATIONS - Met with patient and her son, [name]. - She was very reluctant for conversation and did not  fully participate during encounter.  She became visibly agitated and withdrawn with any suggestion of an outcome other than her full recovery to her prior baseline back in September of this year when she was living independently. - She would like to remain full code. - She specifically stated she is not interested in hospice. - She states that her goal is to become well enough to return to her home in [place]. She has limited insight into how sick she truly is, but she does admit her condition has continued to decline. - She would like to pursue placement for rehabilitation on discharge. - She is agreeable to completion of HCPOA naming her son, [name], as her Air traffic controller. I placed consult spiritual care to begin this process. - Discussed with social work as well as Dr. Wyline Copas.  Code Status/Advance Care Planning: Full code    Code Status Orders        Start     Ordered   11/30/15 0726  Full code   Continuous     11/30/15 0726     Symptom Management:   Dyspnea: Reports that she has improvement in her dyspnea after using as needed morphine. Continue same.  Palliative Prophylaxis:   Bowel Regimen and Frequent Pain Assessment  Additional Recommendations (Limitations, Scope, Preferences):  Full Scope Treatment  Psycho-social/Spiritual:  Support System: [facility] Desire for further Chaplaincy support: No Additional Recommendations: Caregiving  Support/Resources  Prognosis: Unable to determine. She has had a significant decline in her functional status since September. She has also had multiple hospitalizations/ED visits for COPD exacerbations averaging 1 per month for the past  4 months. She is essentially bedbound and has had a bed to chair existence since September of this year and requires continuous 3 L of oxygen at home to maintain sats. Based upon these, she would likely qualify for hospice support with an expected prognosis of less than 6 months. At this time, however,  she is not interested in further information regarding potential benefits of hospice.  Discharge Planning: [facility] for rehab with Palliative care service follow-up   Chief Complaint/ Primary Diagnoses: Present on Admission:  . HCAP (healthcare-associated pneumonia) . Atrial fibrillation ([place]) . S/P placement of cardiac pacemaker  I have reviewed the medical record, interviewed the patient and family, and examined the patient. The following aspects are pertinent.  Past Medical History  Diagnosis Date  . Pacemaker   . A-fib ([place])   . Hypertension    Social History   Social History  . Marital Status: Widowed    Spouse Name: N/A  . Number of Children: N/A  . Years of Education: N/A   Social History Main Topics  . Smoking status: Former Research scientist (life sciences)  . Smokeless tobacco: None  . Alcohol Use: 0.0 oz/week    0 Standard drinks or equivalent per week  . Drug Use: Yes  . Sexual Activity: Not Asked   Other Topics Concern  . None   Social History Narrative   Family History  Problem Relation Age of Onset  . Heart disease Father   . Cancer Mother   . Cancer Brother   . Heart disease Brother    Scheduled Meds: . apixaban  5 mg Oral BID  . ceFEPime (MAXIPIME) IV  1 g Intravenous Q24H  . cholecalciferol  1,000 Units Oral Daily  . diltiazem  60 mg Oral BID  . feeding supplement (ENSURE ENLIVE)  237 mL Oral QID  . guaiFENesin  600 mg Oral BID  . insulin aspart  0-9 Units Subcutaneous 6 times per day  . ipratropium-albuterol  3 mL Nebulization TID  . mirtazapine  3.75 mg Oral QHS  . pantoprazole  40 mg Oral Daily  . vancomycin  750 mg Intravenous Q24H   Continuous Infusions:  PRN Meds:.benzonatate, ipratropium-albuterol, LORazepam, magnesium hydroxide, morphine injection, polyethylene glycol Medications Prior to Admission:  Prior to Admission medications   Medication Sig Start Date End Date Taking? Authorizing Provider  albuterol (PROVENTIL) (2.5 MG/3ML) 0.083%  nebulizer solution Take 3 mLs (2.5 mg total) by nebulization every 4 (four) hours as needed for wheezing. Patient taking differently: Take 2.5 mg by nebulization 4 (four) times daily.  11/04/15  Yes Nicolette Bang, DO  apixaban (ELIQUIS) 5 MG TABS tablet Take 2 tablets (10 mg total) by mouth 2 (two) times daily. Through 12/13. Then, taken 1 tablet (5 mg) by mouth 2 (two) times daily. Patient taking differently: Take 5 mg by mouth 2 (two) times daily.  11/04/15  Yes Nicolette Bang, DO  cholecalciferol (VITAMIN D) 1000 UNITS tablet Take 1,000 Units by mouth daily.   Yes Historical Provider, MD  diltiazem (CARDIZEM SR) 60 MG 12 hr capsule Take 60 mg by mouth 2 (two) times daily.   Yes Historical Provider, MD  diphenhydrAMINE (BENADRYL) 25 MG tablet Take 25 mg by mouth at bedtime.   Yes Historical Provider, MD  ENSURE (ENSURE) Take 237 mLs by mouth 4 (four) times daily.   Yes Historical Provider, MD  guaiFENesin (MUCINEX) 600 MG 12 hr tablet Take 600 mg by mouth 2 (two) times daily.   Yes Historical Provider,  MD  LORazepam (ATIVAN) 0.5 MG tablet Take 0.5 mg by mouth every 8 (eight) hours as needed for anxiety.    Yes Historical Provider, MD  magnesium hydroxide (MILK OF MAGNESIA) 400 MG/5ML suspension Take 60 mLs by mouth daily as needed for mild constipation.   Yes Historical Provider, MD  Melatonin 5 MG TABS Take 5 mg by mouth at bedtime.   Yes Historical Provider, MD  mirtazapine (REMERON) 7.5 MG tablet Take 3.75 mg by mouth at bedtime.   Yes Historical Provider, MD  omeprazole (PRILOSEC) 20 MG capsule Take 20 mg by mouth daily.   Yes Historical Provider, MD  ALPRAZolam Duanne Moron) 0.25 MG tablet Take 0.25 mg by mouth at bedtime as needed for sleep.    Historical Provider, MD  ondansetron (ZOFRAN) 4 MG tablet Take 4 mg by mouth every 8 (eight) hours as needed for nausea or vomiting.    Historical Provider, MD  polyethylene glycol (MIRALAX / GLYCOLAX) packet Take 17 g by mouth daily as  needed for mild constipation.     Historical Provider, MD   No Known Allergies  Review of Systems  Constitutional: Positive for activity change and appetite change.  Respiratory: Positive for chest tightness, shortness of breath and wheezing.   Psychiatric/Behavioral: Positive for sleep disturbance.  All other systems reviewed and are negative.   Physical Exam  General: Alert, awake, in no acute distress. Elderly, cachectic female HEENT: No bruits, no goiter, no JVD Heart: Regular rate and rhythm. No murmur appreciated. Lungs: Fair air movement with scattered wheeze, clear Abdomen: Soft, nontender, nondistended, positive bowel sounds.  Ext: No significant edema Skin: Warm and dry Neuro: Grossly intact, nonfocal.  Vital Signs: BP 127/57 mmHg  Pulse 74  Temp(Src) 98.1 F (36.7 C) (Oral)  Resp 18  Ht 5' (1.524 m)  Wt 44.77 kg (98 lb 11.2 oz)  BMI 19.28 kg/m2  SpO2 96%  SpO2: SpO2: 96 % O2 Device:SpO2: 96 % O2 Flow Rate: .O2 Flow Rate (L/min): 3 L/min  IO: Intake/output summary:  Intake/Output Summary (Last 24 hours) at 12/02/15 1721 Last data filed at 12/02/15 1500  Gross per 24 hour  Intake    644 ml  Output   1850 ml  Net  -1206 ml    LBM: Last BM Date: 11/29/15 Baseline Weight: Weight: 44.77 kg (98 lb 11.2 oz) Most recent weight: Weight: 44.77 kg (98 lb 11.2 oz)      Palliative Assessment/Data:  Flowsheet Rows        Most Recent Value   Intake Tab    Referral Department  Hospitalist   Unit at Time of Referral  Oncology Unit   Palliative Care Primary Diagnosis  Pulmonary   Date Notified  12/01/15   Palliative Care Type  New Palliative care   Reason for referral  Clarify Goals of Care   Date of Admission  11/29/15   Date first seen by Palliative Care  12/02/15   # of days Palliative referral response time  1 Day(s)   # of days IP prior to Palliative referral  2   Clinical Assessment    Palliative Performance Scale Score  30%   Pain Max last 24 hours  0     Pain Min Last 24 hours  0   Dyspnea Max Last 24 Hours  8   Dyspnea Min Last 24 hours  3   Psychosocial & Spiritual Assessment    Palliative Care Outcomes    Patient/Family meeting held?  Yes  Who was at the meeting?  Patient, Son   Palliative Care Outcomes  Counseled regarding hospice, ACP counseling assistance   Palliative Care follow-up planned  Yes, Facility      Additional Data Reviewed:  CBC:    Component Value Date/Time   WBC 5.8 12/02/2015 1347   HGB 12.5 12/02/2015 1347   HCT 40.0 12/02/2015 1347   PLT 157 12/02/2015 1347   MCV 101.3* 12/02/2015 1347   NEUTROABS 3.2 11/29/2015 2228   LYMPHSABS 0.2* 11/29/2015 2228   MONOABS 0.0* 11/29/2015 2228   EOSABS 0.0 11/29/2015 2228   BASOSABS 0.0 11/29/2015 2228   Comprehensive Metabolic Panel:    Component Value Date/Time   NA 140 12/02/2015 1347   K 4.0 12/02/2015 1347   CL 103 12/02/2015 1347   CO2 30 12/02/2015 1347   BUN 30* 12/02/2015 1347   CREATININE 0.70 12/02/2015 1347   GLUCOSE 127* 12/02/2015 1347   CALCIUM 8.9 12/02/2015 1347   AST 22 11/01/2015 1755   ALT 17 11/01/2015 1755   ALKPHOS 65 11/01/2015 1755   BILITOT 0.7 11/01/2015 1755   PROT 6.2* 11/01/2015 1755   ALBUMIN 2.9* 11/01/2015 1755     Time In: 1420 Time Out: 1515 Time Total: 55 Greater than 50%  of this time was spent counseling and coordinating care related to the above assessment and plan.  Signed by: Micheline Rough, MD  Micheline Rough, MD  12/02/2015, 5:21 PM  Please contact Palliative Medicine Team phone at [phone] for questions and concerns.

## 2015-12-02 NOTE — Progress Notes (Signed)
[facility] PROGRESS NOTE  Brooke PennerMary Curry RUE:[id number] DOB: [birth year] DOA: 11/29/2015 PCP: Herschel SenegalHABER, MICHELE, MD  HPI/Brief narrative 82 year old female with a past medical history significant for COPD on 3 liters of nasal cannula oxygen, atrial fibrillation, s/p pacemaker; who presents with complaints of progressively worsening shortness. Pt was last admitted in 12/16 where she was briefly treated for possible PNA, but later thought to have chronic CXR findings and abx stopped. Pt admitted for worsening PNA  Assessment/Plan: Suspected HCAP (healthcare-associated pneumonia):  -Patient presenting from nursing facility with complaints of shortness of breath.  -Found to have elevated d-dimer however CT angiogram without signs of acute clot. Instead, findings of right lower lobe consolidation consistent with pneumonia. - For now, continue on empiric antibiotics of vancomycin and Zosyn - Follow sputum cultures - Mucinex - Duonebs    Atrial fibrillation:  -Chronic. Chadsvasc 4 -Continue Eliquis and Cardizem  Anxiety -Continue home Xanax  S/P placement of cardiac pacemaker:  - Stable   Gerd - Pharmacy substitution of omeprazole for Protonix   COPD - On baseline 3LNC - Significant emphysematous changes on CT per my own read - Pt would likely benefit from Hospice level care given severity of COPD and number of recent re-admissions, agreed upon by son at bedside.  - Appreciate input by Palliative Care. Pt seemed reluctant to discuss end of life issues and wishes to remain full code. Pt does, however, agree to formally name pt's son, Nadine CountsBob, as HCPOA  Code Status: Full Family Communication: Pt in room, family at bedside Disposition Plan: D/c when off IV abx   Consultants:  Palliative Care  Procedures:    Antibiotics: Anti-infectives    Start     Dose/Rate Route Frequency Ordered Stop   12/01/15 0600  vancomycin (VANCOCIN) IVPB 750 mg/150 ml premix     750 mg 150 mL/hr  over 60 Minutes Intravenous Every 24 hours 11/30/15 0812     11/30/15 0800  ceFEPIme (MAXIPIME) 1 g in dextrose 5 % 50 mL IVPB     1 g 100 mL/hr over 30 Minutes Intravenous Every 24 hours 11/30/15 0726 12/07/15 0959   11/30/15 0415  vancomycin (VANCOCIN) IVPB 1000 mg/200 mL premix     1,000 mg 200 mL/hr over 60 Minutes Intravenous  Once 11/30/15 0413 11/30/15 0554   11/30/15 0415  piperacillin-tazobactam (ZOSYN) IVPB 3.375 g     3.375 g 100 mL/hr over 30 Minutes Intravenous  Once 11/30/15 0413 11/30/15 0520      HPI/Subjective: Still coughing and subjectively sob  Objective: Filed Vitals:   12/02/15 0330 12/02/15 0418 12/02/15 0819 12/02/15 1410  BP:  131/44  127/57  Pulse:  67  74  Temp:  97.4 F (36.3 C)  98.1 F (36.7 C)  TempSrc:  Oral  Oral  Resp:  19  18  Height:      Weight:      SpO2: 99% 92% 93% 96%    Intake/Output Summary (Last 24 hours) at 12/02/15 1748 Last data filed at 12/02/15 1500  Gross per 24 hour  Intake    644 ml  Output   1850 ml  Net  -1206 ml   Filed Weights   11/30/15 [id number]  Weight: 44.77 kg (98 lb 11.2 oz)    Exam:   General:  Awake, in nad, laying in bed  Cardiovascular: regular, s1, s2  Respiratory: Slightly increased resp effort, coarse breath sounds throughout   Abdomen: soft, nondistended  Musculoskeletal: perfused, no clubbing, no cyanosis  Data Reviewed:  Basic Metabolic Panel:  Recent Labs Lab 11/29/15 2228 12/01/15 0442 12/02/15 1347  NA 138 142 140  K 3.9 3.9 4.0  CL 106 106 103  CO2 22 29 30   GLUCOSE 331* 96 127*  BUN 21* 22* 30*  CREATININE 0.79 0.80 0.70  CALCIUM 8.8* 8.9 8.9   Liver Function Tests: No results for input(s): AST, ALT, ALKPHOS, BILITOT, PROT, ALBUMIN in the last 168 hours. No results for input(s): LIPASE, AMYLASE in the last 168 hours. No results for input(s): AMMONIA in the last 168 hours. CBC:  Recent Labs Lab 11/29/15 2228 12/01/15 0442 12/02/15 1347  WBC 3.5* 5.8 5.8  NEUTROABS  3.2  --   --   HGB 12.5 11.2* 12.5  HCT 38.9 36.3 40.0  MCV 99.0 101.4* 101.3*  PLT 167 140* 157   Cardiac Enzymes:  Recent Labs Lab 11/29/15 2228  TROPONINI <0.03   BNP (last 3 results)  Recent Labs  11/01/15 1755  BNP 320.5*    ProBNP (last 3 results) No results for input(s): PROBNP in the last 8760 hours.  CBG:  Recent Labs Lab 12/01/15 2356 12/02/15 0344 12/02/15 0746 12/02/15 1135 12/02/15 1646  GLUCAP 72 111* 85 123* 104*    No results found for this or any previous visit (from the past 240 hour(s)).   Studies: No results found.  Scheduled Meds: . apixaban  5 mg Oral BID  . ceFEPime (MAXIPIME) IV  1 g Intravenous Q24H  . cholecalciferol  1,000 Units Oral Daily  . diltiazem  60 mg Oral BID  . feeding supplement (ENSURE ENLIVE)  237 mL Oral QID  . guaiFENesin  600 mg Oral BID  . insulin aspart  0-9 Units Subcutaneous 6 times per day  . ipratropium-albuterol  3 mL Nebulization TID  . mirtazapine  3.75 mg Oral QHS  . pantoprazole  40 mg Oral Daily  . vancomycin  750 mg Intravenous Q24H   Continuous Infusions:   Principal Problem:   HCAP (healthcare-associated pneumonia) Active Problems:   Atrial fibrillation ([facility])   S/P placement of cardiac pacemaker   GERD (gastroesophageal reflux disease)   CHIU, STEPHEN K  [facility] Pager [phone]. If 7PM-7AM, please contact night-coverage at [email or web address], password [facility] 12/02/2015, 5:48 PM  LOS: 2 days

## 2015-12-02 NOTE — Final Progress Note (Signed)
Previous note on wrong patient.  Disreguard

## 2015-12-02 NOTE — Progress Notes (Signed)
PT Cancellation Note  Patient Details Name: Lysbeth PennerMary Swoveland MRN: [id number] DOB: [birth year]   Cancelled Treatment:    Reason Eval/Treat Not Completed: Other (comment) (pt sleeping this morning, noted palliative care meeting pending this afternoon)   Mayerly Kaman,KATHrine E 12/02/2015, 4:07 PM Zenovia JarredKati Praise Stennett, PT, DPT 12/02/2015 Pager: [phone]

## 2015-12-02 NOTE — Final Progress Note (Signed)
[facility].  Husband at bedside.  Patient's port and left wrist Iv discontinued.  Catheter intact.  [facility].

## 2015-12-03 LAB — CBC
HEMATOCRIT: 38.8 % (ref 36.0–46.0)
HEMOGLOBIN: 11.9 g/dL — AB (ref 12.0–15.0)
MCH: 31 pg (ref 26.0–34.0)
MCHC: 30.7 g/dL (ref 30.0–36.0)
MCV: 101 fL — ABNORMAL HIGH (ref 78.0–100.0)
Platelets: 156 10*3/uL (ref 150–400)
RBC: 3.84 MIL/uL — ABNORMAL LOW (ref 3.87–5.11)
RDW: 16.2 % — ABNORMAL HIGH (ref 11.5–15.5)
WBC: 4.6 10*3/uL (ref 4.0–10.5)

## 2015-12-03 LAB — GLUCOSE, CAPILLARY
GLUCOSE-CAPILLARY: 82 mg/dL (ref 65–99)
Glucose-Capillary: 77 mg/dL (ref 65–99)
Glucose-Capillary: 126 mg/dL — ABNORMAL HIGH (ref 65–99)
Glucose-Capillary: 74 mg/dL (ref 65–99)

## 2015-12-03 LAB — BASIC METABOLIC PANEL
Anion gap: 7 (ref 5–15)
BUN: 30 mg/dL — AB (ref 6–20)
CHLORIDE: 103 mmol/L (ref 101–111)
CO2: 30 mmol/L (ref 22–32)
Calcium: 8.7 mg/dL — ABNORMAL LOW (ref 8.9–10.3)
Creatinine, Ser: 0.61 mg/dL (ref 0.44–1.00)
GFR calc Af Amer: >60 mL/min (ref >60)
GLUCOSE: 85 mg/dL (ref 65–99)
POTASSIUM: 3.7 mmol/L (ref 3.5–5.1)
SODIUM: 140 mmol/L (ref 135–145)

## 2015-12-03 MED ORDER — TRIAMCINOLONE ACETONIDE 0.1 % EX OINT
TOPICAL_OINTMENT | Freq: Three times a day (TID) | CUTANEOUS | Status: DC
  Filled 2015-12-03: qty 15

## 2015-12-03 MED ORDER — TAMSULOSIN HCL 0.4 MG PO CAPS: 0.4000 mg | ORAL_CAPSULE | Freq: Every day | ORAL | Status: AC

## 2015-12-03 MED ORDER — LEVOFLOXACIN 750 MG PO TABS: 750.0000 mg | ORAL_TABLET | ORAL | Status: AC

## 2015-12-03 MED ORDER — BENZONATATE 100 MG PO CAPS: 100.0000 mg | ORAL_CAPSULE | Freq: Three times a day (TID) | ORAL | Status: AC | PRN

## 2015-12-03 MED ORDER — LEVOFLOXACIN 750 MG PO TABS
750.0000 mg | ORAL_TABLET | ORAL | Status: DC
  Administered 2015-12-03: 750 mg via ORAL
  Filled 2015-12-03: qty 1

## 2015-12-03 MED ORDER — OXYCODONE-ACETAMINOPHEN 5-325 MG PO TABS
1.0000 | ORAL_TABLET | Freq: Once | ORAL | Status: AC
  Administered 2015-12-03: 1 via ORAL
  Filled 2015-12-03: qty 1

## 2015-12-03 MED ORDER — TRIAMCINOLONE ACETONIDE 0.1 % EX OINT: 1.0000 "application " | TOPICAL_OINTMENT | Freq: Two times a day (BID) | CUTANEOUS | Status: AC | PRN

## 2015-12-03 NOTE — Clinical Social Work Placement (Signed)
   CLINICAL SOCIAL WORK PLACEMENT  NOTE  Date:  12/03/2015  Patient Details  Name: Brooke Curry MRN: [id number] Date of Birth: 1927/01/12  Clinical Social Work is seeking post-discharge placement for this patient at the Skilled  Nursing Facility level of care (*CSW will initial, date and re-position this form in  chart as items are completed):  Yes   Patient/family provided with [facility] Clinical Social Work Department's list of facilities offering this level of care within the geographic area requested by the patient (or if unable, by the patient's family).  Yes   Patient/family informed of their freedom to choose among providers that offer the needed level of care, that participate in Medicare, Medicaid or managed care program needed by the patient, have an available bed and are willing to accept the patient.  Yes   Patient/family informed of [facility]'s ownership interest in [facility] and [facility], as well as of the fact that they are under no obligation to receive care at these facilities.  PASRR submitted to EDS on       PASRR number received on       Existing PASRR number confirmed on 12/03/15     FL2 transmitted to all facilities in geographic area requested by pt/family on 12/03/15     FL2 transmitted to all facilities within larger geographic area on       Patient informed that his/her managed care company has contracts with or will negotiate with certain facilities, including the following:        Yes   Patient/family informed of bed offers received.  [facility]     Physician recommends and patient chooses bed at      [facility] 12/03/15.  Patient to be transferred to facility by ambulance Sharin Mons(PTAR)     Patient family notified on 12/03/15 of transfer.  Name of family member notified:  pt notified at bedside and pt son, Leotis ShamesBob Amadon notified via telephone     PHYSICIAN Please sign FL2      Additional Comment:    _______________________________________________ Orson EvaKIDD, Basim Bartnik A, LCSW 12/03/2015, 3:25 PM

## 2015-12-03 NOTE — Clinical Social Work Placement (Signed)
   CLINICAL SOCIAL WORK PLACEMENT  NOTE  Date:  12/03/2015  Patient Details  Name: Brooke Curry MRN: [id number] Date of Birth: 1927/07/25  Clinical Social Work is seeking post-discharge placement for this patient at the Skilled  Nursing Facility level of care (*CSW will initial, date and re-position this form in  chart as items are completed):  Yes   Patient/family provided with [place] Clinical Social Work Department's list of facilities offering this level of care within the geographic area requested by the patient (or if unable, by the patient's family).  Yes   Patient/family informed of their freedom to choose among providers that offer the needed level of care, that participate in Medicare, Medicaid or managed care program needed by the patient, have an available bed and are willing to accept the patient.  Yes   Patient/family informed of [facility]'s ownership interest in [facility] and [facility], as well as of the fact that they are under no obligation to receive care at these facilities.  PASRR submitted to EDS on       PASRR number received on       Existing PASRR number confirmed on 12/03/15     FL2 transmitted to all facilities in geographic area requested by pt/family on 12/03/15     FL2 transmitted to all facilities within larger geographic area on       Patient informed that his/her managed care company has contracts with or will negotiate with certain facilities, including the following:        Yes   Patient/family informed of bed offers received.  [facility]     Physician recommends and patient chooses bed at      [facility]  .  Patient to be transferred to facility by       Patient family notified on   of transfer.  Name of family member notified:        PHYSICIAN       Additional Comment:    _______________________________________________ Orson EvaKIDD, Brooke Ilyas A, LCSW 12/03/2015, 12:04  PM

## 2015-12-03 NOTE — Care Management Note (Signed)
Case Management Note  Patient Details  Name: Brooke PennerMary Curry MRN: [id number] Date of Birth: 1927-04-03  Expected Discharge Date:   (unknown)               Expected Discharge Plan:  Skilled Nursing Facility  In-House Referral:  Clinical Social Work  Discharge planning Services  CM Consult  Post Acute Care Choice:    Choice offered to:     DME Arranged:    DME Agency:     HH Arranged:    HH Agency:     Status of Service:  Completed, signed off  Medicare Important Message Given:  Yes Date Medicare IM Given:    Medicare IM give by:    Date Additional Medicare IM Given:    Additional Medicare Important Message give by:     If discussed at Long Length of Stay Meetings, dates discussed:    Additional Comments:  Bartholome BillCLEMENTS, Gerritt Galentine H, RN 12/03/2015, 3:00 PM

## 2015-12-03 NOTE — Progress Notes (Signed)
Pharmacy Antibiotic Follow-up Note  Brooke PennerMary Breese is a 82 y.o. year-old female admitted on 11/29/2015.  The patient is currently on day 4 of vancomycin and cefepime for pneumonia.   Assessment/Plan: After discussion with Dr. Rhona Leavenshiu, vanc and cefepime will be changed to levaquin 750mg  po q48h for CrCl <4350mls/min  Temp (24hrs), Avg:97.7 F (36.5 C), Min:97.2 F (36.2 C), Max:98.1 F (36.7 C)   Recent Labs Lab 11/29/15 2228 12/01/15 0442 12/02/15 1347 12/03/15 0413  WBC 3.5* 5.8 5.8 4.6    Recent Labs Lab 11/29/15 2228 12/01/15 0442 12/02/15 1347 12/03/15 0413  CREATININE 0.79 0.80 0.70 0.61   Estimated Creatinine Clearance: 35.7 mL/min (by C-G formula based on Cr of 0.61).    No Known Allergies  Antimicrobials this admission: 1/3 Zosyn x1 1/3 >>vancomycin >> 1/6 1/3 >> cefeime >> 1/6 Levels/dose changes this admission -  Microbiology results:   Thank you for allowing pharmacy to be a part of this patient's care.  Arley Phenixllen Linh Hedberg RPh 12/03/2015, 10:52 AM Pager [phone]

## 2015-12-03 NOTE — Progress Notes (Signed)
Patient feels the urge to urinate but unable to go. Bladder does not feel distended, bladder scan done only has 83 ml volume.

## 2015-12-03 NOTE — Care Management Important Message (Signed)
Important Message  Patient Details  Name: Brooke PennerMary Curry MRN: [id number] Date of Birth: [birth year]   Medicare Important Message Given:  Yes    Haskell FlirtJamison, Akshaya Toepfer 12/03/2015, 11:46 AMImportant Message  Patient Details  Name: Brooke PennerMary Curry MRN: [id number] Date of Birth: [birth year]   Medicare Important Message Given:  Yes    Haskell FlirtJamison, Rajinder Mesick 12/03/2015, 11:46 AM

## 2015-12-03 NOTE — Progress Notes (Signed)
CSW continuing to follow.   CSW spoke with pt, pt son, and pt daughter-in-law this morning regarding disposition planning. Pt family was hopeful that pt could return to [facility], but PT worked with pt this morning and pt was unable to ambulate and pt knees buckled with transfers. CSW spoke with [facility] RN, Billy CoastNicole Davis with pt daughter-in-law present and discussed pt PT treatment note and [facility] feels pt will be better served [facility] following hospitalization given pt weakness at this time. Pt and pt family agree to [facility] and [facility].   CSW contacted [facility] and facility confirmed that facility could accept pt and availability for today.   Per MD, pt medically ready for discharge today.  CSW facilitated pt discharge needs including contacting facility, faxing pt discharge information via [employer] [facility], discussing with pt at bedside and pt son, Nadine CountsBob via telephone, providing RN phone number to call report, and arranging ambulance transport for pt to [facility].   Pt and pt family appreciative of CSW support and assistance.   No further social work needs identified at this time.  CSW signing off.   Loletta SpecterSuzanna Tressa Maldonado, MSW, LCSW Clinical Social Work [phone]

## 2015-12-03 NOTE — Progress Notes (Signed)
Physical Therapy Treatment Patient Details Name: Brooke Curry MRN: [id number] DOB: [birth year] Today's Date: 12/03/2015    History of Present Illness 82 yo female admitted with Pna. Hx of COPD, HTN, A fib    PT Comments    Attempted sit to stand x3 trials with RW, pt's knees buckled in standing. Mod assist to pivot to recliner. 9/10 back/shoulder pain and fatigue limit activity tolerance. SaO2 94% on 3L O2 [place]. SNF recommended. Discussed recommendation with pt and SW.    Follow Up Recommendations  SNF;Supervision/Assistance - 24 hour (unless pt will have appropriate level of care at home/facility)     Equipment Recommendations  Rolling walker with 5" wheels;Wheelchair (measurements PT) (if she does not have one)    Recommendations for Other Services       Precautions / Restrictions Precautions Precautions: Fall Precaution Comments: pt denies h/o falls Restrictions Weight Bearing Restrictions: No    Mobility  Bed Mobility Overal bed mobility: Needs Assistance Bed Mobility: Supine to Sit     Supine to sit: HOB elevated;Min assist     General bed mobility comments: increased time, verbal cues for technique, HOB up 40*, min A to scoot to EOB (pain in shoulders with attempt to scoot to EOB with UEs)   Transfers Overall transfer level: Needs assistance Equipment used: Rolling walker (2 wheeled) Transfers: Sit to/from [employer] Sit to Stand: Mod assist Stand pivot transfers: Mod assist       General transfer comment: sit to stand x 3, pt's BLEs buckled in standing with BUE support on RW, MOD A for stand pivot transfer to recliner; activity tolerance limited by 9/10 back/shoulder pain and fatigue, pain meds requested  Ambulation/Gait             General Gait Details: pt unable   Stairs            Wheelchair Mobility    Modified Rankin (Stroke Patients Only)       Balance             Standing balance-Leahy Scale: Zero                       Cognition Arousal/Alertness: Awake/alert Behavior During Therapy: WFL for tasks assessed/performed Overall Cognitive Status: Within Functional Limits for tasks assessed                      Exercises      General Comments        Pertinent Vitals/Pain Pain Score: 9  Pain Location: shoulders, back Pain Descriptors / Indicators: Sore Pain Intervention(s): Limited activity within patient's tolerance;Monitored during session;Patient requesting pain meds-RN notified    Home Living                      Prior Function            PT Goals (current goals can now be found in the care plan section) Acute Rehab PT Goals Patient Stated Goal: to be able to walk PT Goal Formulation: With patient Time For Goal Achievement: 12/14/15 Potential to Achieve Goals: Fair Progress towards PT goals: Not progressing toward goals - comment (pain/fatigue limit progress)    Frequency  Min 3X/week    PT Plan Current plan remains appropriate    Co-evaluation             End of Session Equipment Utilized During Treatment: Gait belt;Oxygen Activity Tolerance: Patient limited by fatigue;Patient limited  by pain Patient left: in chair;with call bell/phone within reach;with chair alarm set     Time: 1040-1104 PT Time Calculation (min) (ACUTE ONLY): 24 min  Charges:  $Therapeutic Activity: 23-37 mins                    G Codes:      Tamala SerUhlenberg, Crisol Muecke Kistler 12/03/2015, 11:13 AM [phone]

## 2015-12-03 NOTE — Progress Notes (Signed)
Patient still very uncomfortable, in and out cath done and obtained amber urine 275 ml, no blood noted in urine, MD wants to put indwelling cath in before transferring patient but patient refused the indwelling catheter.

## 2015-12-03 NOTE — Discharge Summary (Addendum)
Physician Discharge Summary  Brooke Curry VWU:[id number] DOB: Sep 06, 1927 DOA: 11/29/2015  PCP: [name], MD  Admit date: 11/29/2015 Discharge date: 12/03/2015  Time spent: 20 minutes  Recommendations for Outpatient Follow-up:  1. Follow up with PCP in 2-3 weeks  2. Please note, patient should be discharged with foley cath for bladder outlet obstruction, but refuses. She was started on flomax. Please monitor urine output closely, consider foley cath if <=150cc urine out in 6hrs  Discharge Diagnoses:  Principal Problem:   HCAP (healthcare-associated pneumonia) Active Problems:   Atrial fibrillation (HCC)   S/P placement of cardiac pacemaker   GERD (gastroesophageal reflux disease)   Discharge Condition: Improved  Diet recommendation: heart healthy  Filed Weights   11/30/15 0652  Weight: 44.77 kg (98 lb 11.2 oz)    History of present illness:  Please review dictated H and P from 1/2 for details. Briefly, 82 year old female with a past medical history significant for COPD on 3 liters of nasal cannula oxygen, atrial fibrillation, s/p pacemaker; who presents with complaints of progressively worsening shortness. Pt was last admitted in 12/16 where she was briefly treated for possible PNA, but later thought to have chronic CXR findings and abx stopped. Pt admitted for worsening PNA  Hospital Course:  Suspected HCAP (healthcare-associated pneumonia):  -Patient presenting from nursing facility with complaints of shortness of breath. -Patient was recently admitted for PNA, thought to have chronic changes on imaging, thus patient was discharged without antibiotics  -This admission, patient was found to have elevated d-dimer however CT angiogram without signs of acute clot. Instead, findings of right lower lobe consolidation consistent with pneumonia. - Patient was continued on empiric vancomycin and Zosyn - Mucinex - Duonebs  - Patient clinically improved and will complete course of  levaquin on discharge   Atrial fibrillation:  -Chronic. Chadsvasc 4 -Continue Eliquis and Cardizem  Anxiety -Continue home Xanax  S/P placement of cardiac pacemaker:  - Stable   Gerd - Pharmacy substitution of omeprazole for Protonix   COPD - On baseline 3LNC - Significant emphysematous changes on CT per my own read - Pt would likely benefit from Hospice level care given severity of COPD and number of recent re-admissions, agreed upon by son at bedside.  - Appreciate input by Palliative Care. Pt seemed reluctant to discuss end of life issues and wishes to remain full code. Pt does, however, agree to formally name pt's son, Brooke Curry, as HCPOA  Bladder Outlet Obstruction - on day of discharge around 80cc noted on bladder scan but on i/o cath, over 275cc urine noted. - Patient started on flomax. She would greatly benefit from foley cath and  bladder training as outpatient however patient refuses foley cath - Please monitor patient closely for signs of bladder outlet obstruction. Low threshold for foley cath placement  Consultations:  Palliative Care  Discharge Exam: Filed Vitals:   12/02/15 2110 12/03/15 0440 12/03/15 1113 12/03/15 1330  BP: 129/72 114/57  118/55  Pulse: 88 80  85  Temp: 97.2 F (36.2 C) 97.7 F (36.5 C)  97.3 F (36.3 C)  TempSrc: Oral Oral  Oral  Resp: 22 18  20   Height:      Weight:      SpO2: 97% 96% 100% 99%    General: awake, in nad Cardiovascular: regular, s1, s2 Respiratory: normal resp effort, no wheezing  Discharge Instructions     Medication List    TAKE these medications        albuterol (2.5 MG/3ML) 0.083%  nebulizer solution  Commonly known as:  PROVENTIL  Take 3 mLs (2.5 mg total) by nebulization every 4 (four) hours as needed for wheezing.     ALPRAZolam 0.25 MG tablet  Commonly known as:  XANAX  Take 0.25 mg by mouth at bedtime as needed for sleep.     apixaban 5 MG Tabs tablet  Commonly known as:  ELIQUIS  Take 2  tablets (10 mg total) by mouth 2 (two) times daily. Through 12/13. Then, taken 1 tablet (5 mg) by mouth 2 (two) times daily.     benzonatate 100 MG capsule  Commonly known as:  TESSALON  Take 1 capsule (100 mg total) by mouth 3 (three) times daily as needed for cough.     cholecalciferol 1000 units tablet  Commonly known as:  VITAMIN D  Take 1,000 Units by mouth daily.     diltiazem 60 MG 12 hr capsule  Commonly known as:  CARDIZEM SR  Take 60 mg by mouth 2 (two) times daily.     diphenhydrAMINE 25 MG tablet  Commonly known as:  BENADRYL  Take 25 mg by mouth at bedtime.     ENSURE  Take 237 mLs by mouth 4 (four) times daily.     guaiFENesin 600 MG 12 hr tablet  Commonly known as:  MUCINEX  Take 600 mg by mouth 2 (two) times daily.     levofloxacin 750 MG tablet  Commonly known as:  LEVAQUIN  Take 1 tablet (750 mg total) by mouth every other day.  Start taking on:  12/05/2015     LORazepam 0.5 MG tablet  Commonly known as:  ATIVAN  Take 0.5 mg by mouth every 8 (eight) hours as needed for anxiety.     magnesium hydroxide 400 MG/5ML suspension  Commonly known as:  MILK OF MAGNESIA  Take 60 mLs by mouth daily as needed for mild constipation.     Melatonin 5 MG Tabs  Take 5 mg by mouth at bedtime.     mirtazapine 7.5 MG tablet  Commonly known as:  REMERON  Take 3.75 mg by mouth at bedtime.     omeprazole 20 MG capsule  Commonly known as:  PRILOSEC  Take 20 mg by mouth daily.     ondansetron 4 MG tablet  Commonly known as:  ZOFRAN  Take 4 mg by mouth every 8 (eight) hours as needed for nausea or vomiting.     polyethylene glycol packet  Commonly known as:  MIRALAX / GLYCOLAX  Take 17 g by mouth daily as needed for mild constipation.     tamsulosin 0.4 MG Caps capsule  Commonly known as:  FLOMAX  Take 1 capsule (0.4 mg total) by mouth daily.     triamcinolone ointment 0.1 %  Commonly known as:  KENALOG  Apply 1 application topically 2 (two) times daily as needed.  To affected areas       No Known Allergies Follow-up Information    Follow up with Herschel SenegalHABER, MICHELE, MD. Schedule an appointment as soon as possible for a visit in 2 weeks.   Specialties:  Internal Medicine, Geriatric Medicine   Why:  Hospital follow up   Contact information:   [address] [place] [id number] [phone]       Follow up with [facility] .   Specialty:  Skilled Nursing Facility   Contact information:   [address] [place] [id number] [phone]       The results of  significant diagnostics from this hospitalization (including imaging, microbiology, ancillary and laboratory) are listed below for reference.    Significant Diagnostic Studies: Ct Angio Chest Pe W/cm &/or Wo Cm  11/30/2015  CLINICAL DATA:  Acute onset of shortness of breath. Elevated D-dimer. Initial encounter. EXAM: CT ANGIOGRAPHY CHEST WITH CONTRAST TECHNIQUE: Multidetector CT imaging of the chest was performed using the standard protocol during bolus administration of intravenous contrast. Multiplanar CT image reconstructions and MIPs were obtained to evaluate the vascular anatomy. CONTRAST:  80mL OMNIPAQUE IOHEXOL 350 MG/ML SOLN COMPARISON:  Chest radiograph performed 11/28/2014, and CTA of the chest performed 11/01/2015 FINDINGS: There is no evidence of pulmonary embolus. Dense opacity is noted at the right lower lobe, concerning for pneumonia. Prominent scarring and thickened cavities are noted at the left lung apex, reflecting sequelae of atypical infection. Underlying diffuse emphysematous change is noted bilaterally, most prominent at the right upper lobe. There is no evidence of pleural effusion or pneumothorax. No masses are identified; no abnormal focal contrast enhancement is seen. The ascending thoracic aorta is borderline normal in caliber. A left-sided pacemaker is noted, with leads ending at the right atrium and right ventricle. No pericardial effusion is identified.  The great vessels are grossly unremarkable. No mediastinal lymphadenopathy is appreciated. No axillary lymphadenopathy is seen. The thyroid gland is unremarkable in appearance. The visualized portions of the liver and spleen are unremarkable. No acute osseous abnormalities are seen. Chronic compression deformities are noted at vertebral bodies T5, T6 and T7, most prominent at T7. Review of the MIP images confirms the above findings. IMPRESSION: 1. No evidence of pulmonary embolus. 2. Dense opacity at the right lower lobe, concerning for pneumonia. 3. Prominent scarring and thickened cavities at the left lung apex, reflecting sequelae of atypical infection. Underlying diffuse emphysematous change noted bilaterally, most prominent at the right upper lobe. 4. Chronic compression deformities at vertebral bodies T5, T6 and T7. Electronically Signed   By: Roanna Raider M.D.   On: 11/30/2015 01:50   Dg Chest Port 1 View  11/29/2015  CLINICAL DATA:  Sob increased today. Patient used dual nebulizer 4 times at home, no relief. Hx COPD, pna, AFib, htn EXAM: PORTABLE CHEST 1 VIEW COMPARISON:  11/01/2015 CT and plain film FINDINGS: Lungs are hyperinflated. Again noted is complex cavitary lesion within the left lung apex. Significant emphysematous changes most notable at the right lung apex. Chronic lung markings at the medial right lung base. No focal consolidations or pleural effusions are identified. Left-sided AICD leads to right atrium and right ventricle. IMPRESSION: 1. Chronic changes including left apical cavitary lesion and emphysematous changes. 2.  No evidence for acute cardiopulmonary abnormality. Electronically Signed   By: Norva Pavlov M.D.   On: 11/29/2015 23:01    Microbiology: No results found for this or any previous visit (from the past 240 hour(s)).   Labs: Basic Metabolic Panel:  Recent Labs Lab 11/29/15 2228 12/01/15 0442 12/02/15 1347 12/03/15 0413  NA 138 142 140 140  K 3.9 3.9 4.0  3.7  CL 106 106 103 103  CO2 22 29 30 30   GLUCOSE 331* 96 127* 85  BUN 21* 22* 30* 30*  CREATININE 0.79 0.80 0.70 0.61  CALCIUM 8.8* 8.9 8.9 8.7*   Liver Function Tests: No results for input(s): AST, ALT, ALKPHOS, BILITOT, PROT, ALBUMIN in the last 168 hours. No results for input(s): LIPASE, AMYLASE in the last 168 hours. No results for input(s): AMMONIA in the last 168 hours. CBC:  Recent Labs  Lab 11/29/15 2228 12/01/15 0442 12/02/15 1347 12/03/15 0413  WBC 3.5* 5.8 5.8 4.6  NEUTROABS 3.2  --   --   --   HGB 12.5 11.2* 12.5 11.9*  HCT 38.9 36.3 40.0 38.8  MCV 99.0 101.4* 101.3* 101.0*  PLT 167 140* 157 156   Cardiac Enzymes:  Recent Labs Lab 11/29/15 2228  TROPONINI <0.03   BNP: BNP (last 3 results)  Recent Labs  11/01/15 1755  BNP 320.5*    ProBNP (last 3 results) No results for input(s): PROBNP in the last 8760 hours.  CBG:  Recent Labs Lab 12/02/15 1646 12/02/15 2101 12/02/15 2356 12/03/15 0355 12/03/15 0742  GLUCAP 104* 114* 155* 77 82    Signed:  CHIU, STEPHEN K  [facility] 12/03/2015, 2:30 PM

## 2015-12-04 ENCOUNTER — Emergency Department (HOSPITAL_COMMUNITY): Payer: Medicare Other

## 2015-12-04 ENCOUNTER — Inpatient Hospital Stay (HOSPITAL_COMMUNITY)
Admission: EM | Admit: 2015-12-04 | Discharge: 2015-12-06 | DRG: 190 | Payer: Medicare Other | Attending: Internal Medicine | Admitting: Internal Medicine

## 2015-12-04 ENCOUNTER — Encounter (HOSPITAL_COMMUNITY): Payer: Self-pay

## 2015-12-04 DIAGNOSIS — R0902 Hypoxemia: Secondary | ICD-10-CM | POA: Insufficient documentation

## 2015-12-04 DIAGNOSIS — J449 Chronic obstructive pulmonary disease, unspecified: Secondary | ICD-10-CM | POA: Diagnosis present

## 2015-12-04 DIAGNOSIS — J189 Pneumonia, unspecified organism: Secondary | ICD-10-CM | POA: Diagnosis present

## 2015-12-04 DIAGNOSIS — Z9981 Dependence on supplemental oxygen: Secondary | ICD-10-CM

## 2015-12-04 DIAGNOSIS — Z7901 Long term (current) use of anticoagulants: Secondary | ICD-10-CM

## 2015-12-04 DIAGNOSIS — I10 Essential (primary) hypertension: Secondary | ICD-10-CM | POA: Diagnosis present

## 2015-12-04 DIAGNOSIS — Z8249 Family history of ischemic heart disease and other diseases of the circulatory system: Secondary | ICD-10-CM

## 2015-12-04 DIAGNOSIS — Z809 Family history of malignant neoplasm, unspecified: Secondary | ICD-10-CM

## 2015-12-04 DIAGNOSIS — Z66 Do not resuscitate: Secondary | ICD-10-CM | POA: Diagnosis present

## 2015-12-04 DIAGNOSIS — J441 Chronic obstructive pulmonary disease with (acute) exacerbation: Secondary | ICD-10-CM

## 2015-12-04 DIAGNOSIS — R0682 Tachypnea, not elsewhere classified: Secondary | ICD-10-CM | POA: Diagnosis present

## 2015-12-04 DIAGNOSIS — Z515 Encounter for palliative care: Secondary | ICD-10-CM | POA: Diagnosis present

## 2015-12-04 DIAGNOSIS — Z87891 Personal history of nicotine dependence: Secondary | ICD-10-CM

## 2015-12-04 DIAGNOSIS — J440 Chronic obstructive pulmonary disease with acute lower respiratory infection: Principal | ICD-10-CM | POA: Diagnosis present

## 2015-12-04 DIAGNOSIS — R0600 Dyspnea, unspecified: Secondary | ICD-10-CM | POA: Diagnosis present

## 2015-12-04 DIAGNOSIS — F419 Anxiety disorder, unspecified: Secondary | ICD-10-CM | POA: Diagnosis present

## 2015-12-04 DIAGNOSIS — K219 Gastro-esophageal reflux disease without esophagitis: Secondary | ICD-10-CM | POA: Diagnosis present

## 2015-12-04 DIAGNOSIS — I4891 Unspecified atrial fibrillation: Secondary | ICD-10-CM | POA: Diagnosis present

## 2015-12-04 HISTORY — DX: Chronic obstructive pulmonary disease, unspecified: J44.9

## 2015-12-04 LAB — BRAIN NATRIURETIC PEPTIDE: B NATRIURETIC PEPTIDE 5: 82.3 pg/mL (ref 0.0–100.0)

## 2015-12-04 LAB — CBC WITH DIFFERENTIAL/PLATELET
Basophils Absolute: 0 10*3/uL (ref 0.0–0.1)
Basophils Relative: 0 %
Eosinophils Absolute: 0.2 10*3/uL (ref 0.0–0.7)
Eosinophils Relative: 2 %
HEMATOCRIT: 45.3 % (ref 36.0–46.0)
HEMOGLOBIN: 14.3 g/dL (ref 12.0–15.0)
LYMPHS ABS: 0.6 10*3/uL — AB (ref 0.7–4.0)
LYMPHS PCT: 6 %
MCH: 31 pg (ref 26.0–34.0)
MCHC: 31.6 g/dL (ref 30.0–36.0)
MCV: 98.3 fL (ref 78.0–100.0)
MONOS PCT: 11 %
Monocytes Absolute: 1 10*3/uL (ref 0.1–1.0)
NEUTROS ABS: 7.4 10*3/uL (ref 1.7–7.7)
NEUTROS PCT: 81 %
Platelets: 199 10*3/uL (ref 150–400)
RBC: 4.61 MIL/uL (ref 3.87–5.11)
RDW: 15.8 % — ABNORMAL HIGH (ref 11.5–15.5)
WBC: 9.2 10*3/uL (ref 4.0–10.5)

## 2015-12-04 LAB — I-STAT TROPONIN, ED: TROPONIN I, POC: 0.01 ng/mL (ref 0.00–0.08)

## 2015-12-04 MED ORDER — DEXTROSE 5 % IV SOLN: 1.0000 g | INTRAVENOUS | Status: DC

## 2015-12-04 MED ORDER — DEXTROSE 5 % IV SOLN
2.0000 g | Freq: Once | INTRAVENOUS | Status: AC
  Administered 2015-12-05: 2 g via INTRAVENOUS
  Filled 2015-12-04: qty 2

## 2015-12-04 MED ORDER — VANCOMYCIN HCL IN DEXTROSE 1-5 GM/200ML-% IV SOLN
1000.0000 mg | Freq: Once | INTRAVENOUS | Status: AC
  Administered 2015-12-05: 1000 mg via INTRAVENOUS
  Filled 2015-12-04: qty 200

## 2015-12-04 NOTE — ED Notes (Signed)
Per EMS, pt from nursing facility.Marland Kitchen.Marland Kitchen.Marland Kitchen.Marland Kitchen.ems called out for o2 sat of 83%, pt was recently admitted to a [place] for pneumonia and was released yesterday. It was noted by EMS that pt has poor perfusion in hands. Pt's sat has remained between 90-94% with ems in route which is the pt's normal baseline. Pt wears 3L at all times. VSS. Rhonchi noted by ems in bases. Pt alert and oriented x 4 and able to speak in complete sentences.

## 2015-12-04 NOTE — ED Notes (Signed)
Pulse ox placed on pt's forehead and o2 sat is remaining above 95% on 3L [place]

## 2015-12-05 ENCOUNTER — Encounter (HOSPITAL_COMMUNITY): Payer: Self-pay | Admitting: Family Medicine

## 2015-12-05 DIAGNOSIS — F419 Anxiety disorder, unspecified: Secondary | ICD-10-CM | POA: Diagnosis present

## 2015-12-05 DIAGNOSIS — J449 Chronic obstructive pulmonary disease, unspecified: Secondary | ICD-10-CM | POA: Diagnosis present

## 2015-12-05 DIAGNOSIS — J189 Pneumonia, unspecified organism: Secondary | ICD-10-CM

## 2015-12-05 DIAGNOSIS — I482 Chronic atrial fibrillation: Secondary | ICD-10-CM

## 2015-12-05 DIAGNOSIS — I10 Essential (primary) hypertension: Secondary | ICD-10-CM | POA: Diagnosis present

## 2015-12-05 DIAGNOSIS — I4891 Unspecified atrial fibrillation: Secondary | ICD-10-CM | POA: Diagnosis present

## 2015-12-05 DIAGNOSIS — R0682 Tachypnea, not elsewhere classified: Secondary | ICD-10-CM | POA: Diagnosis present

## 2015-12-05 DIAGNOSIS — Z809 Family history of malignant neoplasm, unspecified: Secondary | ICD-10-CM | POA: Diagnosis not present

## 2015-12-05 DIAGNOSIS — R0600 Dyspnea, unspecified: Secondary | ICD-10-CM | POA: Diagnosis not present

## 2015-12-05 DIAGNOSIS — Z7901 Long term (current) use of anticoagulants: Secondary | ICD-10-CM | POA: Diagnosis not present

## 2015-12-05 DIAGNOSIS — J441 Chronic obstructive pulmonary disease with (acute) exacerbation: Secondary | ICD-10-CM | POA: Insufficient documentation

## 2015-12-05 DIAGNOSIS — K219 Gastro-esophageal reflux disease without esophagitis: Secondary | ICD-10-CM

## 2015-12-05 DIAGNOSIS — Z515 Encounter for palliative care: Secondary | ICD-10-CM | POA: Diagnosis present

## 2015-12-05 DIAGNOSIS — Z9981 Dependence on supplemental oxygen: Secondary | ICD-10-CM | POA: Diagnosis not present

## 2015-12-05 DIAGNOSIS — Z8249 Family history of ischemic heart disease and other diseases of the circulatory system: Secondary | ICD-10-CM | POA: Diagnosis not present

## 2015-12-05 DIAGNOSIS — R0902 Hypoxemia: Secondary | ICD-10-CM | POA: Diagnosis present

## 2015-12-05 DIAGNOSIS — Z87891 Personal history of nicotine dependence: Secondary | ICD-10-CM | POA: Diagnosis not present

## 2015-12-05 DIAGNOSIS — Z66 Do not resuscitate: Secondary | ICD-10-CM | POA: Diagnosis present

## 2015-12-05 DIAGNOSIS — J440 Chronic obstructive pulmonary disease with acute lower respiratory infection: Secondary | ICD-10-CM | POA: Diagnosis present

## 2015-12-05 LAB — URINALYSIS, ROUTINE W REFLEX MICROSCOPIC
Bilirubin Urine: NEGATIVE
Glucose, UA: NEGATIVE mg/dL
Hgb urine dipstick: NEGATIVE
Ketones, ur: NEGATIVE mg/dL
LEUKOCYTES UA: NEGATIVE
NITRITE: NEGATIVE
PH: 7 (ref 5.0–8.0)
Protein, ur: NEGATIVE mg/dL
SPECIFIC GRAVITY, URINE: 1.016 (ref 1.005–1.030)

## 2015-12-05 LAB — CBC WITH DIFFERENTIAL/PLATELET
BASOS PCT: 0 %
Basophils Absolute: 0 10*3/uL (ref 0.0–0.1)
EOS ABS: 0.2 10*3/uL (ref 0.0–0.7)
Eosinophils Relative: 4 %
HCT: 36.8 % (ref 36.0–46.0)
HEMOGLOBIN: 11.6 g/dL — AB (ref 12.0–15.0)
Lymphocytes Relative: 11 %
Lymphs Abs: 0.6 10*3/uL — ABNORMAL LOW (ref 0.7–4.0)
MCH: 31.1 pg (ref 26.0–34.0)
MCHC: 31.5 g/dL (ref 30.0–36.0)
MCV: 98.7 fL (ref 78.0–100.0)
MONOS PCT: 17 %
Monocytes Absolute: 1 10*3/uL (ref 0.1–1.0)
NEUTROS PCT: 68 %
Neutro Abs: 3.8 10*3/uL (ref 1.7–7.7)
Platelets: 158 10*3/uL (ref 150–400)
RBC: 3.73 MIL/uL — ABNORMAL LOW (ref 3.87–5.11)
RDW: 15.7 % — AB (ref 11.5–15.5)
WBC: 5.5 10*3/uL (ref 4.0–10.5)

## 2015-12-05 LAB — BASIC METABOLIC PANEL
Anion gap: 8 (ref 5–15)
BUN: 18 mg/dL (ref 6–20)
CALCIUM: 8.4 mg/dL — AB (ref 8.9–10.3)
CO2: 27 mmol/L (ref 22–32)
CREATININE: 0.54 mg/dL (ref 0.44–1.00)
Chloride: 105 mmol/L (ref 101–111)
Glucose, Bld: 106 mg/dL — ABNORMAL HIGH (ref 65–99)
Potassium: 3.3 mmol/L — ABNORMAL LOW (ref 3.5–5.1)
SODIUM: 140 mmol/L (ref 135–145)

## 2015-12-05 LAB — MRSA PCR SCREENING: MRSA BY PCR: NEGATIVE

## 2015-12-05 MED ORDER — HYDROCODONE-ACETAMINOPHEN 5-325 MG PO TABS
1.0000 | ORAL_TABLET | ORAL | Status: DC | PRN
  Administered 2015-12-05 (×3): 2 via ORAL
  Filled 2015-12-05 (×3): qty 2

## 2015-12-05 MED ORDER — TAMSULOSIN HCL 0.4 MG PO CAPS
0.4000 mg | ORAL_CAPSULE | Freq: Every day | ORAL | Status: DC
  Administered 2015-12-05 – 2015-12-06 (×2): 0.4 mg via ORAL
  Filled 2015-12-05 (×2): qty 1

## 2015-12-05 MED ORDER — CETYLPYRIDINIUM CHLORIDE 0.05 % MT LIQD
7.0000 mL | Freq: Two times a day (BID) | OROMUCOSAL | Status: DC
  Administered 2015-12-05 – 2015-12-06 (×3): 7 mL via OROMUCOSAL

## 2015-12-05 MED ORDER — BENZONATATE 100 MG PO CAPS
100.0000 mg | ORAL_CAPSULE | Freq: Three times a day (TID) | ORAL | Status: DC | PRN
Start: 2015-12-05 — End: 2015-12-06
  Administered 2015-12-05 – 2015-12-06 (×3): 100 mg via ORAL
  Filled 2015-12-05 (×3): qty 1

## 2015-12-05 MED ORDER — SODIUM CHLORIDE 0.9 % IV SOLN
INTRAVENOUS | Status: DC
  Administered 2015-12-05 (×2): via INTRAVENOUS

## 2015-12-05 MED ORDER — DEXTROSE 5 % IV SOLN
1.0000 g | Freq: Two times a day (BID) | INTRAVENOUS | Status: DC
  Administered 2015-12-05: 1 g via INTRAVENOUS
  Filled 2015-12-05 (×2): qty 1

## 2015-12-05 MED ORDER — ENSURE ENLIVE PO LIQD
237.0000 mL | Freq: Four times a day (QID) | ORAL | Status: DC
  Administered 2015-12-05 – 2015-12-06 (×5): 237 mL via ORAL

## 2015-12-05 MED ORDER — VANCOMYCIN HCL 500 MG IV SOLR
500.0000 mg | INTRAVENOUS | Status: DC
  Administered 2015-12-06: 500 mg via INTRAVENOUS
  Filled 2015-12-05: qty 500

## 2015-12-05 MED ORDER — DIPHENHYDRAMINE HCL 25 MG PO TABS: 25.0000 mg | ORAL_TABLET | Freq: Every day | ORAL | Status: DC

## 2015-12-05 MED ORDER — LORAZEPAM 2 MG/ML IJ SOLN
0.5000 mg | INTRAMUSCULAR | Status: DC | PRN
  Administered 2015-12-05 – 2015-12-06 (×2): 0.5 mg via INTRAVENOUS
  Filled 2015-12-05 (×2): qty 1

## 2015-12-05 MED ORDER — MELATONIN 3 MG PO TABS
3.0000 mg | ORAL_TABLET | Freq: Every day | ORAL | Status: DC
  Administered 2015-12-05: 3 mg via ORAL
  Filled 2015-12-05 (×3): qty 1

## 2015-12-05 MED ORDER — APIXABAN 5 MG PO TABS
5.0000 mg | ORAL_TABLET | Freq: Two times a day (BID) | ORAL | Status: DC
  Administered 2015-12-05 – 2015-12-06 (×3): 5 mg via ORAL
  Filled 2015-12-05 (×3): qty 1

## 2015-12-05 MED ORDER — MIRTAZAPINE 7.5 MG PO TABS
3.7500 mg | ORAL_TABLET | Freq: Every day | ORAL | Status: DC
  Administered 2015-12-05: 3.75 mg via ORAL
  Filled 2015-12-05 (×3): qty 1

## 2015-12-05 MED ORDER — IPRATROPIUM-ALBUTEROL 0.5-2.5 (3) MG/3ML IN SOLN
3.0000 mL | Freq: Four times a day (QID) | RESPIRATORY_TRACT | Status: DC
  Administered 2015-12-05 – 2015-12-06 (×3): 3 mL via RESPIRATORY_TRACT
  Filled 2015-12-05 (×4): qty 3

## 2015-12-05 MED ORDER — VITAMIN D 1000 UNITS PO TABS
1000.0000 [IU] | ORAL_TABLET | Freq: Every day | ORAL | Status: DC
Start: 2015-12-05 — End: 2015-12-06
  Administered 2015-12-05 – 2015-12-06 (×2): 1000 [IU] via ORAL
  Filled 2015-12-05 (×3): qty 1

## 2015-12-05 MED ORDER — ONDANSETRON HCL 4 MG/2ML IJ SOLN
4.0000 mg | Freq: Four times a day (QID) | INTRAMUSCULAR | Status: DC | PRN
  Filled 2015-12-05: qty 2

## 2015-12-05 MED ORDER — ALPRAZOLAM 0.25 MG PO TABS: 0.2500 mg | ORAL_TABLET | Freq: Every evening | ORAL | Status: DC | PRN

## 2015-12-05 MED ORDER — MORPHINE SULFATE (PF) 2 MG/ML IV SOLN
1.0000 mg | INTRAVENOUS | Status: DC | PRN
  Administered 2015-12-05 – 2015-12-06 (×7): 1 mg via INTRAVENOUS
  Filled 2015-12-05 (×7): qty 1

## 2015-12-05 MED ORDER — POTASSIUM CHLORIDE CRYS ER 20 MEQ PO TBCR
40.0000 meq | EXTENDED_RELEASE_TABLET | Freq: Once | ORAL | Status: AC
  Administered 2015-12-05: 40 meq via ORAL
  Filled 2015-12-05: qty 2

## 2015-12-05 MED ORDER — ZOLPIDEM TARTRATE 5 MG PO TABS
5.0000 mg | ORAL_TABLET | Freq: Every day | ORAL | Status: DC
  Administered 2015-12-05: 5 mg via ORAL
  Filled 2015-12-05 (×2): qty 1

## 2015-12-05 MED ORDER — DILTIAZEM HCL ER 60 MG PO CP12
60.0000 mg | ORAL_CAPSULE | Freq: Two times a day (BID) | ORAL | Status: DC
  Administered 2015-12-05 – 2015-12-06 (×3): 60 mg via ORAL
  Filled 2015-12-05 (×5): qty 1

## 2015-12-05 MED ORDER — ACETAMINOPHEN 325 MG PO TABS
650.0000 mg | ORAL_TABLET | ORAL | Status: DC | PRN
  Administered 2015-12-05: 650 mg via ORAL
  Filled 2015-12-05 (×2): qty 2

## 2015-12-05 MED ORDER — PANTOPRAZOLE SODIUM 40 MG PO TBEC
40.0000 mg | DELAYED_RELEASE_TABLET | Freq: Every day | ORAL | Status: DC
  Administered 2015-12-05 – 2015-12-06 (×2): 40 mg via ORAL
  Filled 2015-12-05 (×2): qty 1

## 2015-12-05 MED ORDER — POLYETHYLENE GLYCOL 3350 17 G PO PACK
17.0000 g | PACK | Freq: Every day | ORAL | Status: DC | PRN
Start: 2015-12-05 — End: 2015-12-06
  Administered 2015-12-05 – 2015-12-06 (×2): 17 g via ORAL
  Filled 2015-12-05 (×2): qty 1

## 2015-12-05 MED ORDER — DEXTROSE 5 % IV SOLN
1.0000 g | INTRAVENOUS | Status: DC
  Administered 2015-12-06: 1 g via INTRAVENOUS
  Filled 2015-12-05: qty 1

## 2015-12-05 NOTE — Progress Notes (Signed)
Brooke PennerMary Curry is a 82 y.o. female with PMH of atrial fibrillation on Eliquis, COPD on home O2 around the clock, and hypertension who presents to the ED from her SNF for evaluation of dyspnea and low O2 saturation. Spoke with son in detail. Requesting for residential hospice tomorrow.   Kathlen ModyVijaya Cruzita Lipa, MD [phone]

## 2015-12-05 NOTE — Progress Notes (Signed)
ANTIBIOTIC CONSULT NOTE - INITIAL  Pharmacy Consult for Vancomycin and Cefepime  Indication: rule out pneumonia  No Known Allergies  Patient Measurements: Height: 5\' 6"  (167.6 cm) Weight: 103 lb (46.72 kg) IBW/kg (Calculated) : 59.3  Vital Signs: Temp: 98.1 F (36.7 C) (01/07 2242) Temp Source: Oral (01/07 2242) BP: 127/65 mmHg (01/07 2242) Pulse Rate: 97 (01/07 2242) Intake/Output from previous day:   Intake/Output from this shift:    Labs:  Recent Labs  12/02/15 1347 12/03/15 0413 12/04/15 2250  WBC 5.8 4.6 9.2  HGB 12.5 11.9* 14.3  PLT 157 156 199  CREATININE 0.70 0.61  --    Estimated Creatinine Clearance: 37.2 mL/min (by C-G formula based on Cr of 0.61). No results for input(s): VANCOTROUGH, VANCOPEAK, VANCORANDOM, GENTTROUGH, GENTPEAK, GENTRANDOM, TOBRATROUGH, TOBRAPEAK, TOBRARND, AMIKACINPEAK, AMIKACINTROU, AMIKACIN in the last 72 hours.   Microbiology: No results found for this or any previous visit (from the past 720 hour(s)).  Medical History: Past Medical History  Diagnosis Date  . Pacemaker   . A-fib (HCC)   . Hypertension   . COPD (chronic obstructive pulmonary disease) (HCC)     Medications:  Albuterol  Eliquis  Tessalon  Flomax  APAP  Xanax  Vit D  Cardizem  Mucinex  Ativan  Melatonin  Remeron  Prilosec   Assessment: 82 y.o. female with cough/SOB, possible PNA, for empiric antibiotics  Goal of Therapy:  Vancomycin trough level 15-20 mcg/ml  Plan:  Vancomycin 1 g IV as ordered in ED, then 500 mg IV q24h Change Cefepime 1 g IV q12h  Eddie CandleAbbott, Rafe Mackowski [place] 12/05/2015,12:38 AM

## 2015-12-05 NOTE — ED Provider Notes (Signed)
CSN: [id number]     Arrival date & time 12/04/15  2233 History   First MD Initiated Contact with Patient 12/04/15 2305     Chief Complaint  Patient presents with  . Shortness of Breath  . Cough  . low o2 sats      (Consider location/radiation/quality/duration/timing/severity/associated sxs/prior Treatment) HPI Comments: 82 year old female with a past medical history significant for COPD on 3 liters of nasal cannula oxygen, atrial fibrillation, s/p pacemaker; who presents with complaints of hypoxia and dib. Pt was discharged yday, after being admitted for HCAP. She reports that since the discharge, she didn't receive q 4 hours breathing tx as she was getting in the hospital, pain meds as she was getting here - and her cough, pleuritic chest and back pain and dib have worsened. SNF reported hypoxic event with O2 sats in the 80s despite her being on 3 L O2, so she was sent to the ER.   ROS 10 Systems reviewed and are negative for acute change except as noted in the HPI.      Patient is a 82 y.o. female presenting with shortness of breath and cough. The history is provided by the patient, medical records and a relative.  Shortness of Breath Associated symptoms: cough   Cough Associated symptoms: shortness of breath     Past Medical History  Diagnosis Date  . Pacemaker   . A-fib (HCC)   . Hypertension   . COPD (chronic obstructive pulmonary disease) [facility])    Past Surgical History  Procedure Laterality Date  . Phrenic nerve pacemaker implantation    . Laparoscopic hysterectomy    . Cholecystectomy    . Pacemaker placement  2010  . Hernia repair     Family History  Problem Relation Age of Onset  . Heart disease Father   . Cancer Mother   . Cancer Brother   . Heart disease Brother    Social History  Substance Use Topics  . Smoking status: Former Games developer  . Smokeless tobacco: None  . Alcohol Use: No     Comment: wine twice a month   OB History    No data available      Review of Systems  Respiratory: Positive for cough and shortness of breath.       Allergies  Review of patient's allergies indicates no known allergies.  Home Medications   Prior to Admission medications   Medication Sig Start Date End Date Taking? Authorizing Provider  acetaminophen (TYLENOL) 325 MG tablet Take 650 mg by mouth every 4 (four) hours as needed for mild pain.   Yes Historical Provider, MD  albuterol (PROVENTIL) (2.5 MG/3ML) 0.083% nebulizer solution Take 3 mLs (2.5 mg total) by nebulization every 4 (four) hours as needed for wheezing. Patient taking differently: Take 2.5 mg by nebulization 4 (four) times daily.  11/04/15  Yes Arvilla Market, DO  ALPRAZolam Prudy Feeler) 0.25 MG tablet Take 0.25 mg by mouth at bedtime as needed for sleep.   Yes Historical Provider, MD  apixaban (ELIQUIS) 5 MG TABS tablet Take 2 tablets (10 mg total) by mouth 2 (two) times daily. Through 12/13. Then, taken 1 tablet (5 mg) by mouth 2 (two) times daily. Patient taking differently: Take 5 mg by mouth 2 (two) times daily.  11/04/15  Yes Arvilla Market, DO  benzonatate (TESSALON) 100 MG capsule Take 1 capsule (100 mg total) by mouth 3 (three) times daily as needed for cough. 12/03/15  Yes Jerald Kief, MD  cholecalciferol (VITAMIN D) 1000 UNITS tablet Take 1,000 Units by mouth daily.   Yes Historical Provider, MD  diltiazem (CARDIZEM SR) 60 MG 12 hr capsule Take 60 mg by mouth 2 (two) times daily.   Yes Historical Provider, MD  diphenhydrAMINE (BENADRYL) 25 MG tablet Take 25 mg by mouth at bedtime.   Yes Historical Provider, MD  ENSURE (ENSURE) Take 237 mLs by mouth 4 (four) times daily.   Yes Historical Provider, MD  guaiFENesin (MUCINEX) 600 MG 12 hr tablet Take 600 mg by mouth 2 (two) times daily.   Yes Historical Provider, MD  LORazepam (ATIVAN) 0.5 MG tablet Take 0.5 mg by mouth every 8 (eight) hours as needed for anxiety.    Yes Historical Provider, MD  magnesium hydroxide (MILK  OF MAGNESIA) 400 MG/5ML suspension Take 60 mLs by mouth daily as needed for mild constipation.   Yes Historical Provider, MD  Melatonin 5 MG TABS Take 5 mg by mouth at bedtime.   Yes Historical Provider, MD  mirtazapine (REMERON) 7.5 MG tablet Take 3.75 mg by mouth at bedtime.   Yes Historical Provider, MD  morphine (ROXANOL) 20 MG/ML concentrated solution Take 5 mg by mouth every 2 (two) hours as needed for severe pain.   Yes Historical Provider, MD  omeprazole (PRILOSEC) 20 MG capsule Take 20 mg by mouth daily.   Yes Historical Provider, MD  ondansetron (ZOFRAN) 4 MG tablet Take 4 mg by mouth every 8 (eight) hours as needed for nausea or vomiting.   Yes Historical Provider, MD  polyethylene glycol (MIRALAX / GLYCOLAX) packet Take 17 g by mouth daily as needed for mild constipation.    Yes Historical Provider, MD  tamsulosin (FLOMAX) 0.4 MG CAPS capsule Take 1 capsule (0.4 mg total) by mouth daily. 12/03/15  Yes Jerald KiefStephen K Chiu, MD  triamcinolone ointment (KENALOG) 0.1 % Apply 1 application topically 2 (two) times daily as needed. To affected areas 12/03/15  Yes Jerald KiefStephen K Chiu, MD  levofloxacin (LEVAQUIN) 750 MG tablet Take 1 tablet (750 mg total) by mouth every other day. 12/05/15   Jerald KiefStephen K Chiu, MD   BP 127/65 mmHg  Pulse 97  Temp(Src) 98.1 F (36.7 C) (Oral)  Resp 28  Ht 5\' 6"  (1.676 m)  Wt 103 lb (46.72 kg)  BMI 16.63 kg/m2  SpO2 93% Physical Exam  Constitutional: She is oriented to person, place, and time. No distress.  HENT:  Head: Normocephalic and atraumatic.  Eyes: Conjunctivae and EOM are normal. Pupils are equal, round, and reactive to light.  Neck: Normal range of motion. Neck supple. No JVD present.  Cardiovascular: Normal rate and regular rhythm.   Murmur heard. Pulmonary/Chest: Effort normal. No respiratory distress.  L sided rhonchi  Abdominal: Soft. Bowel sounds are normal. She exhibits no distension. There is no tenderness. There is no rebound and no guarding.   Musculoskeletal: She exhibits no edema.  Neurological: She is alert and oriented to person, place, and time.  Skin: Skin is warm and dry.  Nursing note and vitals reviewed.   ED Course  Procedures (including critical care time) Labs Review Labs Reviewed  CBC WITH DIFFERENTIAL/PLATELET - Abnormal; Notable for the following:    RDW 15.8 (*)    Lymphs Abs 0.6 (*)    All other components within normal limits  URINE CULTURE  BRAIN NATRIURETIC PEPTIDE  URINALYSIS, ROUTINE W REFLEX MICROSCOPIC (NOT [facility])  Rosezena SensorI-STAT TROPOININ, ED    Imaging Review Dg Chest 2 View  12/04/2015  CLINICAL  DATA:  o2 sat of 83%, pt was recently admitted to a [place] for pneumonia and was released yesterday. It was noted by EMS that pt has poor perfusion in hands. Pt's sat has remained between 90-94% with ems in route which is the patient's normal baseline. EXAM: CHEST  2 VIEW COMPARISON:  11/29/2015 FINDINGS: Chronic areas of lung scarring, most evident in the left upper lobe, are stable. Stable changes of emphysema. No convincing pneumonia or edema. No pleural effusion or pneumothorax. Cardiac silhouette is normal in size.  No mediastinal hilar masses. Left anterior chest wall sequential pacemaker is stable and well positioned. There are compression fractures along the thoracic spine is stable from the recent chest CT. Bones are diffusely demineralized. IMPRESSION: 1. No acute cardiopulmonary disease.  Stable chronic changes. Electronically Signed   By: Amie Portland M.D.   On: 12/04/2015 23:18   I have personally reviewed and evaluated these images and lab results as part of my medical decision-making.   EKG Interpretation   Date/Time:  Saturday December 04 2015 22:44:27 EST Ventricular Rate:  97 PR Interval:  154 QRS Duration: 124 QT Interval:  379 QTC Calculation: 481 R Axis:   -76 Text Interpretation:  Atrial-sensed ventricular-paced rhythm No further  analysis attempted due to paced rhythm ? trace  depression to V2, non  concordant seen on ECG 11/29/15 Otherwise no significant change Reconfirmed  by FLOYD MD, Reuel Boom [phone]) on 12/04/2015 10:53:50 PM      MDM   Final diagnoses:  HCAP (healthcare-associated pneumonia)  Hypoxia  COPD, frequent exacerbations (HCC)    Pt comes in for worsening of her dib. She is no longer hypoxic or in resp distress -but breathing in the 20s. Son wants patient now to be DNR - and he wants to have serious conversation about Hospice. He thinks mother is declining rapidly, and she is pain.  He doesn't feel comfortable with her going back to the new SNF - as there likely has been some miscommunication which led to her decline.  Will give her vanc and cef Admit Asked admitting team to call Palliative care DNR ordered placed, son is POA    Derwood Kaplan, MD 12/05/15 [phone]

## 2015-12-05 NOTE — H&P (Signed)
[facility] History and Physical  Brooke Curry ZOX:[id number] DOB: 04/17/1927 DOA: 12/04/2015  Referring physician: ED physician PCP: Herschel SenegalHABER, MICHELE, MD  Specialists:   Chief Complaint: Low sats, dyspnea   HPI: Brooke Curry is a 82 y.o. female with PMH of atrial fibrillation on Eliquis, COPD on home O2 around the clock, and hypertension who presents to the ED from her SNF for evaluation of dyspnea and low O2 saturation. The patient is had a difficult course since September 2016, with 7 ER visits over that interval and a dramatic decline in functional status. Her son and POA, Leotis ShamesBob Duffin, is at the bedside and assists with the history. Mr. Katrinka BlazingSmith is concerned that his mother has been suffering, even on her best days, and asks about hospice and palliative care. The patient was just discharged yesterday after treatment for HCAP, but due to some misunderstanding at the SNF, she did not receive the prescribed breathing treatments and her dyspnea worsened. She was reported to have an oxygen saturation of 83% at the nursing facility despite 3 L/m supplemental oxygen and EMS was activated. There was some acrocyanosis noted by EMS upon their arrival and she was transported to the ED.  In ED, patient was found to be afebrile, saturating in the low 90s on 3 L/m nasal cannula, with tachypnea and stable heart rate and blood pressure. A ventricular paced rhythm was noted on EKG and chest x-ray was stable relative to the image obtained on her prior admission earlier in the week. Basic labs returned with an essentially normal CBC, BNP of 82, and troponin of 0.01. A nebulized breathing treatment was administered with some subjective improvement and the hospitalists were asked to admit for evaluation and management of HCAP with recurrent hypoxia.  Where does patient live?    SNF   Can patient participate in ADLs?  Barely     Review of Systems:   General: no fevers, chills, or sweats. Weight loss, poor appetite,  fatigue HEENT: no blurry vision, hearing changes or sore throat Pulm: no wheeze, worsening dyspnea, productive cough CV: no angina or palpitations, chest pain with cough only Abd: no nausea, vomiting, abdominal pain, diarrhea, or constipation GU: no dysuria, hematuria, increased urinary frequency, or urgency, retention Ext: no leg edema Neuro: no focal weakness, numbness, or tingling, no vision change or hearing loss Skin: no rash, no wounds MSK: No muscle spasm, no deformity, no red, hot, or swollen joint Heme: No easy bruising or bleeding Travel history: No recent long distant travel    Allergy: No Known Allergies  Past Medical History  Diagnosis Date  . Pacemaker   . A-fib (HCC)   . Hypertension   . COPD (chronic obstructive pulmonary disease) [facility](HCC)     Past Surgical History  Procedure Laterality Date  . Phrenic nerve pacemaker implantation    . Laparoscopic hysterectomy    . Cholecystectomy    . Pacemaker placement  2010  . Hernia repair      Social History:  reports that she has quit smoking. She does not have any smokeless tobacco history on file. She reports that she does not drink alcohol or use illicit drugs.  Family History:  Family History  Problem Relation Age of Onset  . Heart disease Father   . Cancer Mother   . Cancer Brother   . Heart disease Brother      Prior to Admission medications   Medication Sig Start Date End Date Taking? Authorizing Provider  acetaminophen (TYLENOL) 325 MG tablet  Take 650 mg by mouth every 4 (four) hours as needed for mild pain.   Yes Historical Provider, MD  albuterol (PROVENTIL) (2.5 MG/3ML) 0.083% nebulizer solution Take 3 mLs (2.5 mg total) by nebulization every 4 (four) hours as needed for wheezing. Patient taking differently: Take 2.5 mg by nebulization 4 (four) times daily.  11/04/15  Yes Arvilla Market, DO  ALPRAZolam Prudy Feeler) 0.25 MG tablet Take 0.25 mg by mouth at bedtime as needed for sleep.   Yes Historical  Provider, MD  apixaban (ELIQUIS) 5 MG TABS tablet Take 2 tablets (10 mg total) by mouth 2 (two) times daily. Through 12/13. Then, taken 1 tablet (5 mg) by mouth 2 (two) times daily. Patient taking differently: Take 5 mg by mouth 2 (two) times daily.  11/04/15  Yes Arvilla Market, DO  benzonatate (TESSALON) 100 MG capsule Take 1 capsule (100 mg total) by mouth 3 (three) times daily as needed for cough. 12/03/15  Yes Jerald Kief, MD  cholecalciferol (VITAMIN D) 1000 UNITS tablet Take 1,000 Units by mouth daily.   Yes Historical Provider, MD  diltiazem (CARDIZEM SR) 60 MG 12 hr capsule Take 60 mg by mouth 2 (two) times daily.   Yes Historical Provider, MD  diphenhydrAMINE (BENADRYL) 25 MG tablet Take 25 mg by mouth at bedtime.   Yes Historical Provider, MD  ENSURE (ENSURE) Take 237 mLs by mouth 4 (four) times daily.   Yes Historical Provider, MD  guaiFENesin (MUCINEX) 600 MG 12 hr tablet Take 600 mg by mouth 2 (two) times daily.   Yes Historical Provider, MD  LORazepam (ATIVAN) 0.5 MG tablet Take 0.5 mg by mouth every 8 (eight) hours as needed for anxiety.    Yes Historical Provider, MD  magnesium hydroxide (MILK OF MAGNESIA) 400 MG/5ML suspension Take 60 mLs by mouth daily as needed for mild constipation.   Yes Historical Provider, MD  Melatonin 5 MG TABS Take 5 mg by mouth at bedtime.   Yes Historical Provider, MD  mirtazapine (REMERON) 7.5 MG tablet Take 3.75 mg by mouth at bedtime.   Yes Historical Provider, MD  morphine (ROXANOL) 20 MG/ML concentrated solution Take 5 mg by mouth every 2 (two) hours as needed for severe pain.   Yes Historical Provider, MD  omeprazole (PRILOSEC) 20 MG capsule Take 20 mg by mouth daily.   Yes Historical Provider, MD  ondansetron (ZOFRAN) 4 MG tablet Take 4 mg by mouth every 8 (eight) hours as needed for nausea or vomiting.   Yes Historical Provider, MD  polyethylene glycol (MIRALAX / GLYCOLAX) packet Take 17 g by mouth daily as needed for mild constipation.     Yes Historical Provider, MD  tamsulosin (FLOMAX) 0.4 MG CAPS capsule Take 1 capsule (0.4 mg total) by mouth daily. 12/03/15  Yes Jerald Kief, MD  triamcinolone ointment (KENALOG) 0.1 % Apply 1 application topically 2 (two) times daily as needed. To affected areas 12/03/15  Yes Jerald Kief, MD  levofloxacin (LEVAQUIN) 750 MG tablet Take 1 tablet (750 mg total) by mouth every other day. 12/05/15   Jerald Kief, MD    Physical Exam: Filed Vitals:   12/04/15 2238 12/04/15 2242  BP:  127/65  Pulse:  97  Temp:  98.1 F (36.7 C)  TempSrc:  Oral  Resp:  28  Height:  5\' 6"  (1.676 m)  Weight:  46.72 kg (103 lb)  SpO2: 93% 93%   General: In mild respiratory distress with tachypnea and accessory muscle use  HEENT:       Eyes: PERRL, EOMI, no scleral icterus or conjunctival pallor. Bitemporal wasting, periorbital edema, b/l       ENT: No discharge from the ears or nose, no pharyngeal ulcers, petechiae or exudate.        Neck: No JVD, no bruit, no appreciable mass Heme: No cervical adenopathy, no pallor Cardiac: S1/S2, RRR, difficult auscultation over coarse ronchi. Pulm: Good air movement bilaterally. No pallor or cyanosis. Tachypnea, accessory muscle recruitment, 2-3 words between breaths, coarse ronchi throughout.  Abd: Soft, nondistended, nontender, no rebound pain or gaurding, no mass or organomegaly, BS present. Ext: No LE edema bilaterally. 2+DP/PT pulse bilaterally. Musculoskeletal: No gross deformity, no red, hot, swollen joints   Skin: No rashes or wounds on exposed surfaces  Neuro: Alert, oriented X3, cranial nerves II-XII grossly intact. No focal findings Psych: Patient is not overtly psychotic, mood and affect appropriate.  Labs on Admission:  Basic Metabolic Panel:  Recent Labs Lab 11/29/15 2228 12/01/15 0442 12/02/15 1347 12/03/15 0413  NA 138 142 140 140  K 3.9 3.9 4.0 3.7  CL 106 106 103 103  CO2 22 29 30 30   GLUCOSE 331* 96 127* 85  BUN 21* 22* 30* 30*   CREATININE 0.79 0.80 0.70 0.61  CALCIUM 8.8* 8.9 8.9 8.7*   Liver Function Tests: No results for input(s): AST, ALT, ALKPHOS, BILITOT, PROT, ALBUMIN in the last 168 hours. No results for input(s): LIPASE, AMYLASE in the last 168 hours. No results for input(s): AMMONIA in the last 168 hours. CBC:  Recent Labs Lab 11/29/15 2228 12/01/15 0442 12/02/15 1347 12/03/15 0413 12/04/15 2250  WBC 3.5* 5.8 5.8 4.6 9.2  NEUTROABS 3.2  --   --   --  7.4  HGB 12.5 11.2* 12.5 11.9* 14.3  HCT 38.9 36.3 40.0 38.8 45.3  MCV 99.0 101.4* 101.3* 101.0* 98.3  PLT 167 140* 157 156 199   Cardiac Enzymes:  Recent Labs Lab 11/29/15 2228  TROPONINI <0.03    BNP (last 3 results)  Recent Labs  11/01/15 1755 12/04/15 2250  BNP 320.5* 82.3    ProBNP (last 3 results) No results for input(s): PROBNP in the last 8760 hours.  CBG:  Recent Labs Lab 12/02/15 2356 12/03/15 0355 12/03/15 0742 12/03/15 1158 12/03/15 1618  GLUCAP 155* 77 82 126* 74    Radiological Exams on Admission: Dg Chest 2 View  12/04/2015  CLINICAL DATA:  o2 sat of 83%, pt was recently admitted to a [place] for pneumonia and was released yesterday. It was noted by EMS that pt has poor perfusion in hands. Pt's sat has remained between 90-94% with ems in route which is the patient's normal baseline. EXAM: CHEST  2 VIEW COMPARISON:  11/29/2015 FINDINGS: Chronic areas of lung scarring, most evident in the left upper lobe, are stable. Stable changes of emphysema. No convincing pneumonia or edema. No pleural effusion or pneumothorax. Cardiac silhouette is normal in size.  No mediastinal hilar masses. Left anterior chest wall sequential pacemaker is stable and well positioned. There are compression fractures along the thoracic spine is stable from the recent chest CT. Bones are diffusely demineralized. IMPRESSION: 1. No acute cardiopulmonary disease.  Stable chronic changes. Electronically Signed   By: Amie Portland M.D.   On:  12/04/2015 23:18    EKG: Independently reviewed.  Abnormal findings:   Ventricular paced rhythm   Assessment/Plan  1. HCAP, organism unknown - Infiltrate c/w PNA noted on CTA from 11/29/14 - Was  treated with vanc and Zosyn from 1/3 - 12/02/14 and was to complete the course with Levaquin starting tomorrow  - Will resume broad-spectrum coverage in the hospital given her apparent re-worsening; vancomycin and cefepime with pharmacy consultation  - Continuous pulse oximetry, titrate supplemental O2 to maintain sats >92%  - ID workup from last admission non-revealing as to organism; will re-culture if she spikes a fever  - Pt is dry on exam and will be given a gentle IVF hydration    2. COPD  - On 3 Lpm atc at home  - DuoNebs q6h scheduled, can receive q2h prn  - Steroids avoided given concern for HCAP  - Titrate supplemental O2 to maintain sats >92%  3. Atrial fibrillation  - Continue Eliquis    - Continue Cardizem  - Ventricular paced rhythm on EKG  - Good peripheral perfusion on exam in ED   4. Hypertension  - Normotensive thus far here  - Monitoring for now without antihypertensives    5. GERD - Continue PPI with Protonix    6. Anxiety  - Likely exacerbated by dyspnea, hopefully will improve with tx of HCAP and COPD  - Ativan IV pushes available prn   7. Code status  - Leotis Shames, the patient's son and POA has asked for DNR status and requests palliative care consultation  - Mr. Lounsbury is concerned that his mother has been suffering, even on her best days, with worsening COPD and increasingly frequent hospitalizations   - Pt's son inquiring whether hospice might be an option for his mother   - Palliative care consultation requested through [facility]   DVT ppx:  Eliquis  Code Status: DNR Family Communication:  Yes, patient's son and POA, Luanne Krzyzanowski, at bed side Disposition Plan: Admit to inpatient   Date of Service 12/05/2015    Briscoe Deutscher, MD [facility] Pager  [phone]  If 7PM-7AM, please contact night-coverage [email or web address] Password [facility] 12/05/2015, 12:36 AM

## 2015-12-05 NOTE — Progress Notes (Signed)
Pharmacy Antibiotic Follow-up Note  Brooke Curry is a 82 y.o. year-old female admitted on 12/04/2015.  The patient is currently on day 1 of vancomycin and cefepime for HCAP.  NOTE: she was recently d/c'd on 1/6 after having 4 days of therapy of vancomycin/Zosyn for HCAP. [facility] had not yet been started at facility per documentation when she was admitted.  Assessment/Plan: The dose of cefepime will be adjusted to 1g IV q24h based on renal function.Continue vancomycin 500mg  IV q24h- recommend stopping soon as MRSA PCR is negative (high predictive value against MRSA PNA)   Temp (24hrs), Avg:98.4 F (36.9 C), Min:98.1 F (36.7 C), Max:98.8 F (37.1 C)   Recent Labs Lab 12/01/15 0442 12/02/15 1347 12/03/15 0413 12/04/15 2250 12/05/15 0625  WBC 5.8 5.8 4.6 9.2 5.5    Recent Labs Lab 11/29/15 2228 12/01/15 0442 12/02/15 1347 12/03/15 0413 12/05/15 0625  CREATININE 0.79 0.80 0.70 0.61 0.54   Estimated Creatinine Clearance: 36.3 mL/min (by C-G formula based on Cr of 0.54).    No Known Allergies  Antimicrobials this admission: Vanc 1/7>> Cefepime 1/7>>  Levels/dose changes this admission: None (no vanc troughs were drawn during last admission)  Microbiology results: 1/8 MRSA PCR: neg 1/8 urine: sent  Thank you for allowing pharmacy to be a part of this patient's care.  Brigid ReBajbus, Sueo Cullen PharmD 12/05/2015 12:33 PM

## 2015-12-05 NOTE — Progress Notes (Signed)
NURSING PROGRESS NOTE  Brooke PennerMary Curry [id number] Admission Data: 12/05/2015 1:44 AM Attending Provider: Briscoe Deutscherimothy S Opyd, MD AOZ:HYQMVPCP:HABER, Elon JesterMICHELE, MD Code Status: DNR Allergies:  Review of patient's allergies indicates no known allergies. Past Medical History:   has a past medical history of Pacemaker; A-fib (HCC); Hypertension; and COPD (chronic obstructive pulmonary disease) (HCC). Past Surgical History:   has past surgical history that includes Phrenic nerve pacemaker implantation; Laparoscopic hysterectomy; Cholecystectomy; pacemaker placement (2010); and Hernia repair. Social History:   reports that she has quit smoking. She does not have any smokeless tobacco history on file. She reports that she does not drink alcohol or use illicit drugs.  Brooke PennerMary Curry is a 82 y.o. female patient admitted from ED:   Last Documented Vital Signs: Blood pressure 147/60, pulse 98, temperature 98.8 F (37.1 C), temperature source Oral, resp. rate 20, height 5' (1.524 m), weight 48 kg (105 lb 13.1 oz), SpO2 95 %.  Cardiac Monitoring: Box # NA in place.  IV Fluids:  IV in place, occlusive dsg intact without redness, IV cath antecubital left, condition patent and no redness normal saline.   Skin: bottom red but blanchable  Patient/Family orientated to room. Information packet given to patient/family. Admission inpatient armband information verified with patient/family to include name and date of birth and placed on patient arm. Side rails up x 2, fall assessment and education completed with patient/family. Patient/family able to verbalize understanding of risk associated with falls and verbalized understanding to call for assistance before getting out of bed. Call light within reach. Patient/family able to voice and demonstrate understanding of unit orientation instructions.    Will continue to evaluate and treat per MD orders.   Judee Clararimaine Cuauhtemoc Huegel, RN

## 2015-12-06 DIAGNOSIS — J449 Chronic obstructive pulmonary disease, unspecified: Secondary | ICD-10-CM

## 2015-12-06 DIAGNOSIS — R0600 Dyspnea, unspecified: Secondary | ICD-10-CM

## 2015-12-06 LAB — URINE CULTURE: CULTURE: NO GROWTH

## 2015-12-06 MED ORDER — IPRATROPIUM-ALBUTEROL 0.5-2.5 (3) MG/3ML IN SOLN: 3.0000 mL | Freq: Four times a day (QID) | RESPIRATORY_TRACT | Status: AC

## 2015-12-06 NOTE — Progress Notes (Signed)
[facility] Anticipated DC date: 12/06/15 Family notified: Son, Nadine CountsBob Transport by: Sharin MonsPTAR  CSW signing off.  Cristobal GoldmannNadia Yazhini Mcaulay, [place] Clinical Social Worker [phone]

## 2015-12-06 NOTE — Progress Notes (Signed)
Nsg Discharge Note  Admit Date:  12/04/2015 Discharge date: 12/06/2015   Calie Buttrey to be D/C'd [facility]) per MD order.  AVS completed.  Copy for chart, and copy for patient signed, and dated. Patient/caregiver able to verbalize understanding.  Discharge Medication:   Medication List    STOP taking these medications        LORazepam 0.5 MG tablet  Commonly known as:  ATIVAN      TAKE these medications        acetaminophen 325 MG tablet  Commonly known as:  TYLENOL  Take 650 mg by mouth every 4 (four) hours as needed for mild pain.     albuterol (2.5 MG/3ML) 0.083% nebulizer solution  Commonly known as:  PROVENTIL  Take 3 mLs (2.5 mg total) by nebulization every 4 (four) hours as needed for wheezing.     ALPRAZolam 0.25 MG tablet  Commonly known as:  XANAX  Take 0.25 mg by mouth at bedtime as needed for sleep.     apixaban 5 MG Tabs tablet  Commonly known as:  ELIQUIS  Take 2 tablets (10 mg total) by mouth 2 (two) times daily. Through 12/13. Then, taken 1 tablet (5 mg) by mouth 2 (two) times daily.     benzonatate 100 MG capsule  Commonly known as:  TESSALON  Take 1 capsule (100 mg total) by mouth 3 (three) times daily as needed for cough.     cholecalciferol 1000 units tablet  Commonly known as:  VITAMIN D  Take 1,000 Units by mouth daily.     diltiazem 60 MG 12 hr capsule  Commonly known as:  CARDIZEM SR  Take 60 mg by mouth 2 (two) times daily.     diphenhydrAMINE 25 MG tablet  Commonly known as:  BENADRYL  Take 25 mg by mouth at bedtime.     ENSURE  Take 237 mLs by mouth 4 (four) times daily.     guaiFENesin 600 MG 12 hr tablet  Commonly known as:  MUCINEX  Take 600 mg by mouth 2 (two) times daily.     ipratropium-albuterol 0.5-2.5 (3) MG/3ML Soln  Commonly known as:  DUONEB  Take 3 mLs by nebulization every 6 (six) hours.     levofloxacin 750 MG tablet  Commonly known as:  LEVAQUIN  Take 1 tablet (750 mg total) by mouth every other day.     magnesium hydroxide 400 MG/5ML suspension  Commonly known as:  MILK OF MAGNESIA  Take 60 mLs by mouth daily as needed for mild constipation.     Melatonin 5 MG Tabs  Take 5 mg by mouth at bedtime.     mirtazapine 7.5 MG tablet  Commonly known as:  REMERON  Take 3.75 mg by mouth at bedtime.     morphine 20 MG/ML concentrated solution  Commonly known as:  ROXANOL  Take 5 mg by mouth every 2 (two) hours as needed for severe pain.     omeprazole 20 MG capsule  Commonly known as:  PRILOSEC  Take 20 mg by mouth daily.     ondansetron 4 MG tablet  Commonly known as:  ZOFRAN  Take 4 mg by mouth every 8 (eight) hours as needed for nausea or vomiting.     polyethylene glycol packet  Commonly known as:  MIRALAX / GLYCOLAX  Take 17 g by mouth daily as needed for mild constipation.     tamsulosin 0.4 MG Caps capsule  Commonly known as:  FLOMAX  Take 1 capsule (0.4 mg total) by mouth daily.     triamcinolone ointment 0.1 %  Commonly known as:  KENALOG  Apply 1 application topically 2 (two) times daily as needed. To affected areas        Discharge Assessment: Filed Vitals:   12/06/15 0646 12/06/15 1254  BP: 134/67 148/56  Pulse: 76 82  Temp: 97.6 F (36.4 C) 98.1 F (36.7 C)  Resp: 19 16   No evidence of skin tears noted. IV catheter discontinued intact. Site without signs and symptoms of complications - no redness or edema noted at insertion site, patient denies c/o pain - only slight tenderness at site.  Dressing with slight pressure applied.  D/c Instructions-Education: Discharge instructions given to patient/family with verbalized understanding. D/c education completed with patient/family including follow up instructions, medication list, d/c activities limitations if indicated, with other d/c instructions as indicated by MD - patient able to verbalize understanding, all questions fully answered. Patient instructed to return to ED, call 911, or call MD for any changes in  condition.  Patient escorted via EMS, report called into [place]. Skin-Heels reddened, elevated off of the pillow today, vaginal area slightly reddened, barrier cream applied to protect against moisture. Looks improved since yesterday.   Kern ReapBrumagin, Adalay Azucena L, RN 12/06/2015 1:32 PM

## 2015-12-06 NOTE — Discharge Summary (Signed)
Physician Discharge Summary  Brooke PennerMary Biela AOZ:[id number] DOB: 03/23/1927 DOA: 12/04/2015  PCP: Herschel SenegalHABER, MICHELE, MD  Admit date: 12/04/2015 Discharge date: 12/06/2015  Time spent: 20 minutes  Recommendations for Outpatient Follow-up:  1. Follow up with hospice MD as needed.    Discharge Diagnoses:  Principal Problem:   HCAP (healthcare-associated pneumonia) Active Problems:   Essential hypertension   Atrial fibrillation (HCC)   Dyspnea   GERD (gastroesophageal reflux disease)   COPD (chronic obstructive pulmonary disease) (HCC)   COPD, frequent exacerbations (HCC)   Hypoxia     Diet recommendation: comfort feeds  Filed Weights   12/04/15 2242 12/05/15 0129  Weight: 46.72 kg (103 lb) 48 kg (105 lb 13.1 oz)    History of present illness/ Hospital course:  Brooke Curry is a 82 y.o. female with PMH of atrial fibrillation on Eliquis, COPD on home O2 around the clock, and hypertension who presents to the ED from her SNF for evaluation of dyspnea and low O2 saturation. She was recently admitted for the hcap and copd exacerbation on 1/2 and discharged on 1/6 but was readmitted on 1/7. Cxr does not show any new pneumonia.  Discussed with son , who wanted his mom to be comfortable, pain free and dyspnea free. They wanted to see if she can go to hospice, probably residential hospice. SW consulted and [facility] liason contacted for bed availability. meanw hile we will continue to complete the course of levaquin and added duonebs for breathing.     Procedures:  none  Consultations:  Hospice   Discharge Exam: Filed Vitals:   12/05/15 2319 12/06/15 0646  BP:  134/67  Pulse: 85 76  Temp: 97.7 F (36.5 C) 97.6 F (36.4 C)  Resp: 28 19    General: alert comfortable Cardiovascular: s1s2 Respiratory: ctab  Discharge Instructions   Discharge Instructions    Diet - low sodium heart healthy    Complete by:  As directed      Discharge instructions    Complete by:  As directed    Follow upwith hospice MD as recommended.          Current Discharge Medication List    START taking these medications   Details  ipratropium-albuterol (DUONEB) 0.5-2.5 (3) MG/3ML SOLN Take 3 mLs by nebulization every 6 (six) hours. Qty: 360 mL      CONTINUE these medications which have NOT CHANGED   Details  acetaminophen (TYLENOL) 325 MG tablet Take 650 mg by mouth every 4 (four) hours as needed for mild pain.    albuterol (PROVENTIL) (2.5 MG/3ML) 0.083% nebulizer solution Take 3 mLs (2.5 mg total) by nebulization every 4 (four) hours as needed for wheezing. Qty: 75 mL, Refills: 0    ALPRAZolam (XANAX) 0.25 MG tablet Take 0.25 mg by mouth at bedtime as needed for sleep.    apixaban (ELIQUIS) 5 MG TABS tablet Take 2 tablets (10 mg total) by mouth 2 (two) times daily. Through 12/13. Then, taken 1 tablet (5 mg) by mouth 2 (two) times daily. Qty: 60 tablet, Refills: 0    benzonatate (TESSALON) 100 MG capsule Take 1 capsule (100 mg total) by mouth 3 (three) times daily as needed for cough. Qty: 20 capsule, Refills: 0    cholecalciferol (VITAMIN D) 1000 UNITS tablet Take 1,000 Units by mouth daily.    diltiazem (CARDIZEM SR) 60 MG 12 hr capsule Take 60 mg by mouth 2 (two) times daily.    diphenhydrAMINE (BENADRYL) 25 MG tablet Take 25 mg by  mouth at bedtime.    ENSURE (ENSURE) Take 237 mLs by mouth 4 (four) times daily.    guaiFENesin (MUCINEX) 600 MG 12 hr tablet Take 600 mg by mouth 2 (two) times daily.    magnesium hydroxide (MILK OF MAGNESIA) 400 MG/5ML suspension Take 60 mLs by mouth daily as needed for mild constipation.    Melatonin 5 MG TABS Take 5 mg by mouth at bedtime.    mirtazapine (REMERON) 7.5 MG tablet Take 3.75 mg by mouth at bedtime.    morphine (ROXANOL) 20 MG/ML concentrated solution Take 5 mg by mouth every 2 (two) hours as needed for severe pain.    omeprazole (PRILOSEC) 20 MG capsule Take 20 mg by mouth daily.    ondansetron (ZOFRAN) 4 MG tablet Take  4 mg by mouth every 8 (eight) hours as needed for nausea or vomiting.    polyethylene glycol (MIRALAX / GLYCOLAX) packet Take 17 g by mouth daily as needed for mild constipation.     tamsulosin (FLOMAX) 0.4 MG CAPS capsule Take 1 capsule (0.4 mg total) by mouth daily. Qty: 30 capsule, Refills: 0    triamcinolone ointment (KENALOG) 0.1 % Apply 1 application topically 2 (two) times daily as needed. To affected areas Qty: 30 g, Refills: 0    levofloxacin (LEVAQUIN) 750 MG tablet Take 1 tablet (750 mg total) by mouth every other day. Qty: 3 tablet, Refills: 0      STOP taking these medications     LORazepam (ATIVAN) 0.5 MG tablet        No Known Allergies Follow-up Information    Follow up with [name], MD.   Specialties:  Internal Medicine, Geriatric Medicine   Why:  As needed   Contact information:   [address] [place] [address] [phone]        The results of significant diagnostics from this hospitalization (including imaging, microbiology, ancillary and laboratory) are listed below for reference.    Significant Diagnostic Studies: Dg Chest 2 View  12/04/2015  CLINICAL DATA:  o2 sat of 83%, pt was recently admitted to a [facility] for pneumonia and was released yesterday. It was noted by EMS that pt has poor perfusion in hands. Pt's sat has remained between 90-94% with ems in route which is the patient's normal baseline. EXAM: CHEST  2 VIEW COMPARISON:  11/29/2015 FINDINGS: Chronic areas of lung scarring, most evident in the left upper lobe, are stable. Stable changes of emphysema. No convincing pneumonia or edema. No pleural effusion or pneumothorax. Cardiac silhouette is normal in size.  No mediastinal hilar masses. Left anterior chest wall sequential pacemaker is stable and well positioned. There are compression fractures along the thoracic spine is stable from the recent chest CT. Bones are diffusely demineralized. IMPRESSION: 1. No acute cardiopulmonary  disease.  Stable chronic changes. Electronically Signed   By: Amie Portland M.D.   On: 12/04/2015 23:18   Ct Angio Chest Pe W/cm &/or Wo Cm  11/30/2015  CLINICAL DATA:  Acute onset of shortness of breath. Elevated D-dimer. Initial encounter. EXAM: CT ANGIOGRAPHY CHEST WITH CONTRAST TECHNIQUE: Multidetector CT imaging of the chest was performed using the standard protocol during bolus administration of intravenous contrast. Multiplanar CT image reconstructions and MIPs were obtained to evaluate the vascular anatomy. CONTRAST:  80mL OMNIPAQUE IOHEXOL 350 MG/ML SOLN COMPARISON:  Chest radiograph performed 11/28/2014, and CTA of the chest performed 11/01/2015 FINDINGS: There is no evidence of pulmonary embolus. Dense opacity is noted at the right lower  lobe, concerning for pneumonia. Prominent scarring and thickened cavities are noted at the left lung apex, reflecting sequelae of atypical infection. Underlying diffuse emphysematous change is noted bilaterally, most prominent at the right upper lobe. There is no evidence of pleural effusion or pneumothorax. No masses are identified; no abnormal focal contrast enhancement is seen. The ascending thoracic aorta is borderline normal in caliber. A left-sided pacemaker is noted, with leads ending at the right atrium and right ventricle. No pericardial effusion is identified. The great vessels are grossly unremarkable. No mediastinal lymphadenopathy is appreciated. No axillary lymphadenopathy is seen. The thyroid gland is unremarkable in appearance. The visualized portions of the liver and spleen are unremarkable. No acute osseous abnormalities are seen. Chronic compression deformities are noted at vertebral bodies T5, T6 and T7, most prominent at T7. Review of the MIP images confirms the above findings. IMPRESSION: 1. No evidence of pulmonary embolus. 2. Dense opacity at the right lower lobe, concerning for pneumonia. 3. Prominent scarring and thickened cavities at the left  lung apex, reflecting sequelae of atypical infection. Underlying diffuse emphysematous change noted bilaterally, most prominent at the right upper lobe. 4. Chronic compression deformities at vertebral bodies T5, T6 and T7. Electronically Signed   By: Roanna Raider M.D.   On: 11/30/2015 01:50   Dg Chest Port 1 View  11/29/2015  CLINICAL DATA:  Sob increased today. Patient used dual nebulizer 4 times at home, no relief. Hx COPD, pna, AFib, htn EXAM: PORTABLE CHEST 1 VIEW COMPARISON:  11/01/2015 CT and plain film FINDINGS: Lungs are hyperinflated. Again noted is complex cavitary lesion within the left lung apex. Significant emphysematous changes most notable at the right lung apex. Chronic lung markings at the medial right lung base. No focal consolidations or pleural effusions are identified. Left-sided AICD leads to right atrium and right ventricle. IMPRESSION: 1. Chronic changes including left apical cavitary lesion and emphysematous changes. 2.  No evidence for acute cardiopulmonary abnormality. Electronically Signed   By: Norva Pavlov M.D.   On: 11/29/2015 23:01    Microbiology: Recent Results (from the past 240 hour(s))  Urine culture     Status: None   Collection Time: 12/05/15 12:09 AM  Result Value Ref Range Status   Specimen Description URINE, CATHETERIZED  Final   Special Requests NONE  Final   Culture NO GROWTH 1 DAY  Final   Report Status 12/06/2015 FINAL  Final  MRSA PCR Screening     Status: None   Collection Time: 12/05/15  1:40 AM  Result Value Ref Range Status   MRSA by PCR NEGATIVE NEGATIVE Final    Comment:        The GeneXpert MRSA Assay (FDA approved for NASAL specimens only), is one component of a comprehensive MRSA colonization surveillance program. It is not intended to diagnose MRSA infection nor to guide or monitor treatment for MRSA infections.      Labs: Basic Metabolic Panel:  Recent Labs Lab 11/29/15 2228 12/01/15 0442 12/02/15 1347  12/03/15 0413 12/05/15 0625  NA 138 142 140 140 140  K 3.9 3.9 4.0 3.7 3.3*  CL 106 106 103 103 105  CO2 22 29 30 30 27   GLUCOSE 331* 96 127* 85 106*  BUN 21* 22* 30* 30* 18  CREATININE 0.79 0.80 0.70 0.61 0.54  CALCIUM 8.8* 8.9 8.9 8.7* 8.4*   Liver Function Tests: No results for input(s): AST, ALT, ALKPHOS, BILITOT, PROT, ALBUMIN in the last 168 hours. No results for input(s): LIPASE, AMYLASE  in the last 168 hours. No results for input(s): AMMONIA in the last 168 hours. CBC:  Recent Labs Lab 11/29/15 2228 12/01/15 0442 12/02/15 1347 12/03/15 0413 12/04/15 2250 12/05/15 0625  WBC 3.5* 5.8 5.8 4.6 9.2 5.5  NEUTROABS 3.2  --   --   --  7.4 3.8  HGB 12.5 11.2* 12.5 11.9* 14.3 11.6*  HCT 38.9 36.3 40.0 38.8 45.3 36.8  MCV 99.0 101.4* 101.3* 101.0* 98.3 98.7  PLT 167 140* 157 156 199 158   Cardiac Enzymes:  Recent Labs Lab 11/29/15 2228  TROPONINI <0.03   BNP: BNP (last 3 results)  Recent Labs  11/01/15 1755 12/04/15 2250  BNP 320.5* 82.3     ProBNP (last 3 results) No results for input(s): PROBNP in the last 8760 hours.  CBG:  Recent Labs Lab 12/02/15 2356 12/03/15 0355 12/03/15 0742 12/03/15 1158 12/03/15 1618  GLUCAP 155* 77 82 126* 74       Signed:  Kaylina Cahue MD.  [facility] 12/06/2015, 12:02 PM

## 2015-12-06 NOTE — Care Management Note (Signed)
Case Management Note  Patient Details  Name: Brooke Curry MRN: [id number] Date of Birth: 07/11/1927  Subjective/Objective:    [facility], CSW following.                Action/Plan:   Expected Discharge Date:                  Expected Discharge Plan:  Hospice Medical Facility  In-House Referral:  Clinical Social Work  Discharge planning Services  CM Consult  Post Acute Care Choice:    Choice offered to:     DME Arranged:    DME Agency:     HH Arranged:    HH Agency:     Status of Service:  Completed, signed off  Medicare Important Message Given:    Date Medicare IM Given:    Medicare IM give by:    Date Additional Medicare IM Given:    Additional Medicare Important Message give by:     If discussed at Long Length of Stay Meetings, dates discussed:    Additional Comments:  Leone Havenaylor, Liller Yohn Clinton, RN 12/06/2015, 6:11 PM

## 2015-12-06 NOTE — Consult Note (Addendum)
[facility] Liaison:  [facility] and [facility]. Chart reviewed since September and spoke with son Nadine CountsBob by phone to confirm interest and answer questions. He is working this morning and had limited time to talk. He is aware clinical information is being reviewed. Will give him call back with outcome of review.  Completed paper work with patient's son Nadine CountsBob [facility]. CSW [place] aware.   RN please call report to [phone]  Please fax discharge summary to [phone].  Thank you. Forrestine Himva Maythe Deramo, LCSW [phone]

## 2015-12-29 DEATH — deceased

## 2017-11-15 IMAGING — CR DG CHEST 2V
2 series · 2 of 2 positions shown · non-contrast
Comparison: None.

CLINICAL DATA: Shortness of breath.

EXAM:
CHEST  2 VIEW

[chest lat]
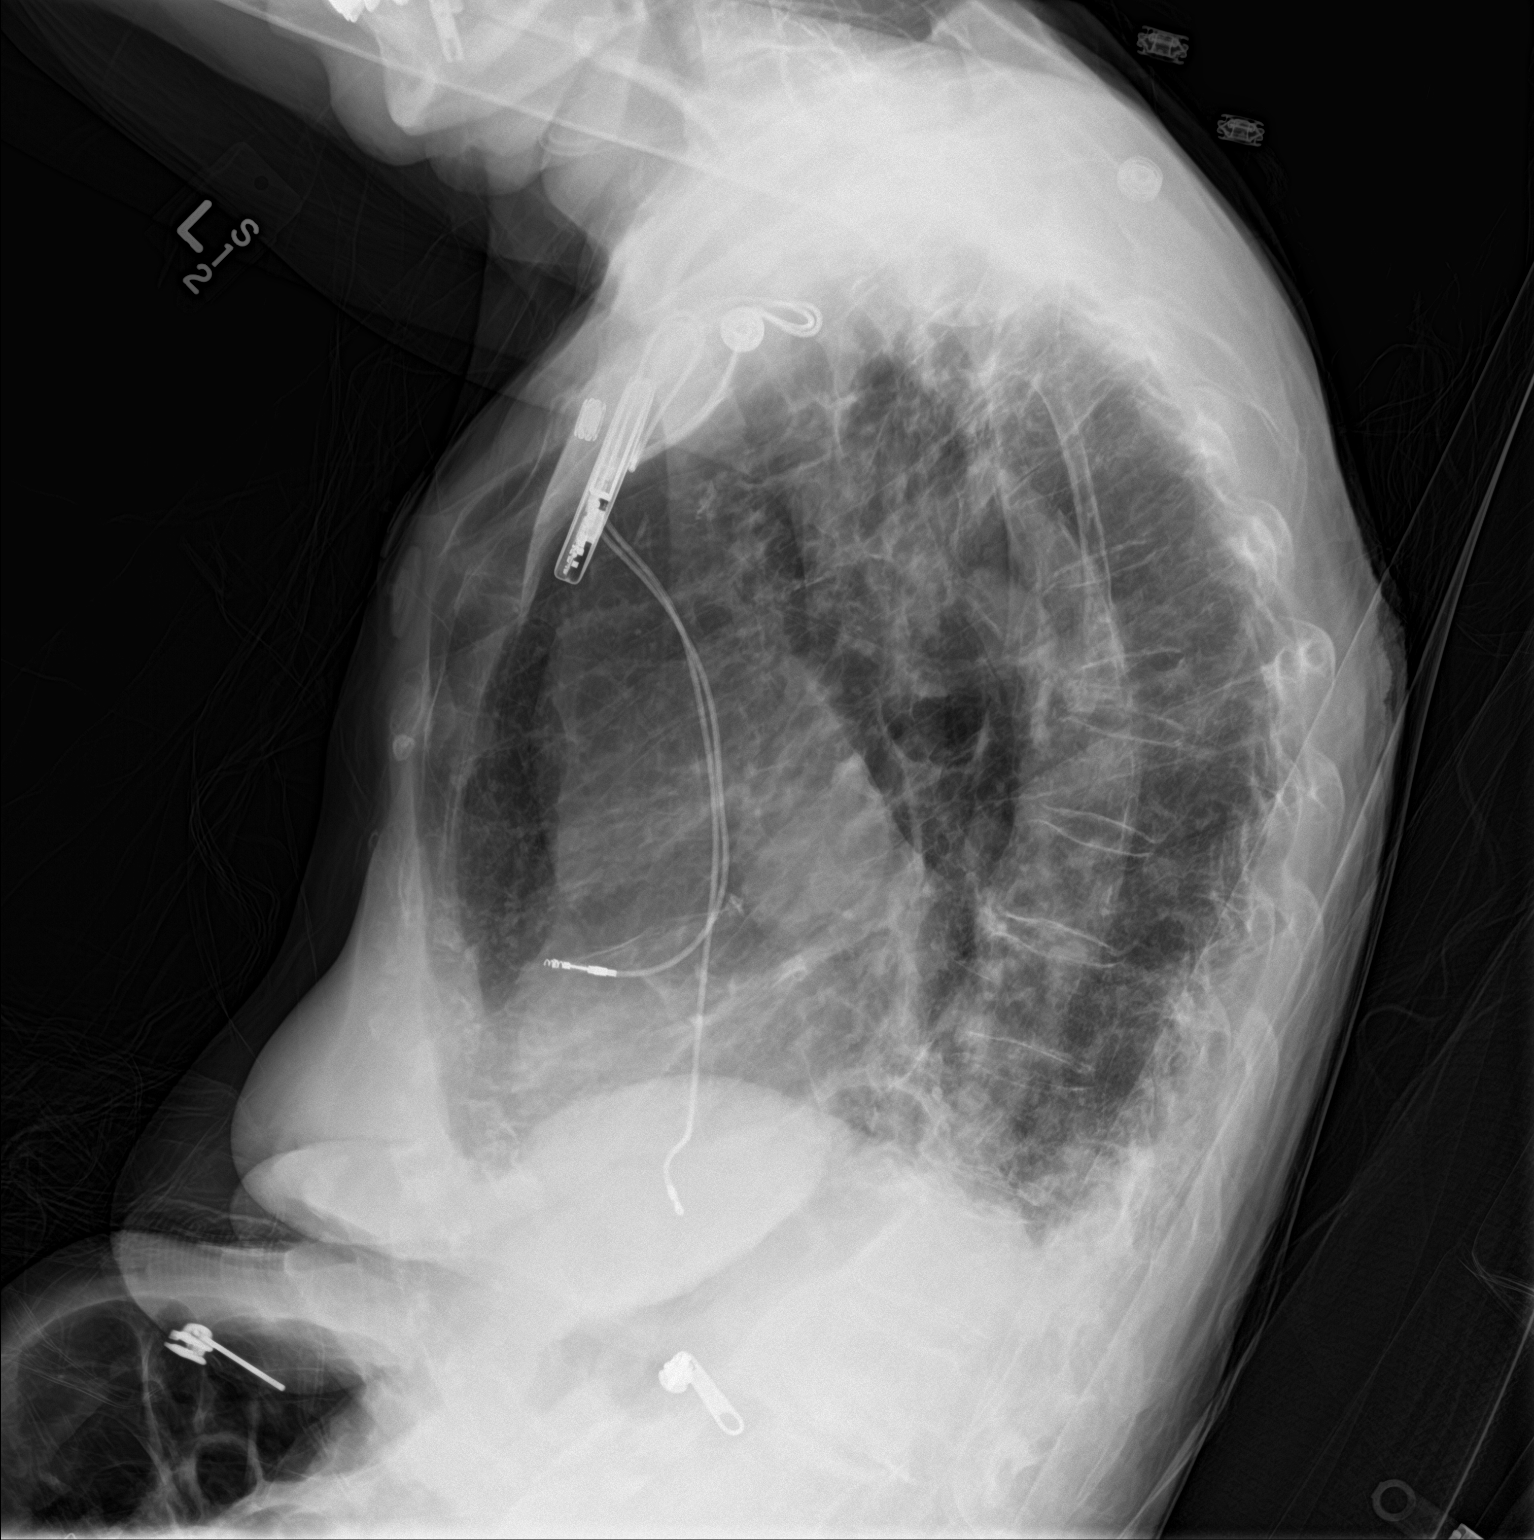

[chest ap]
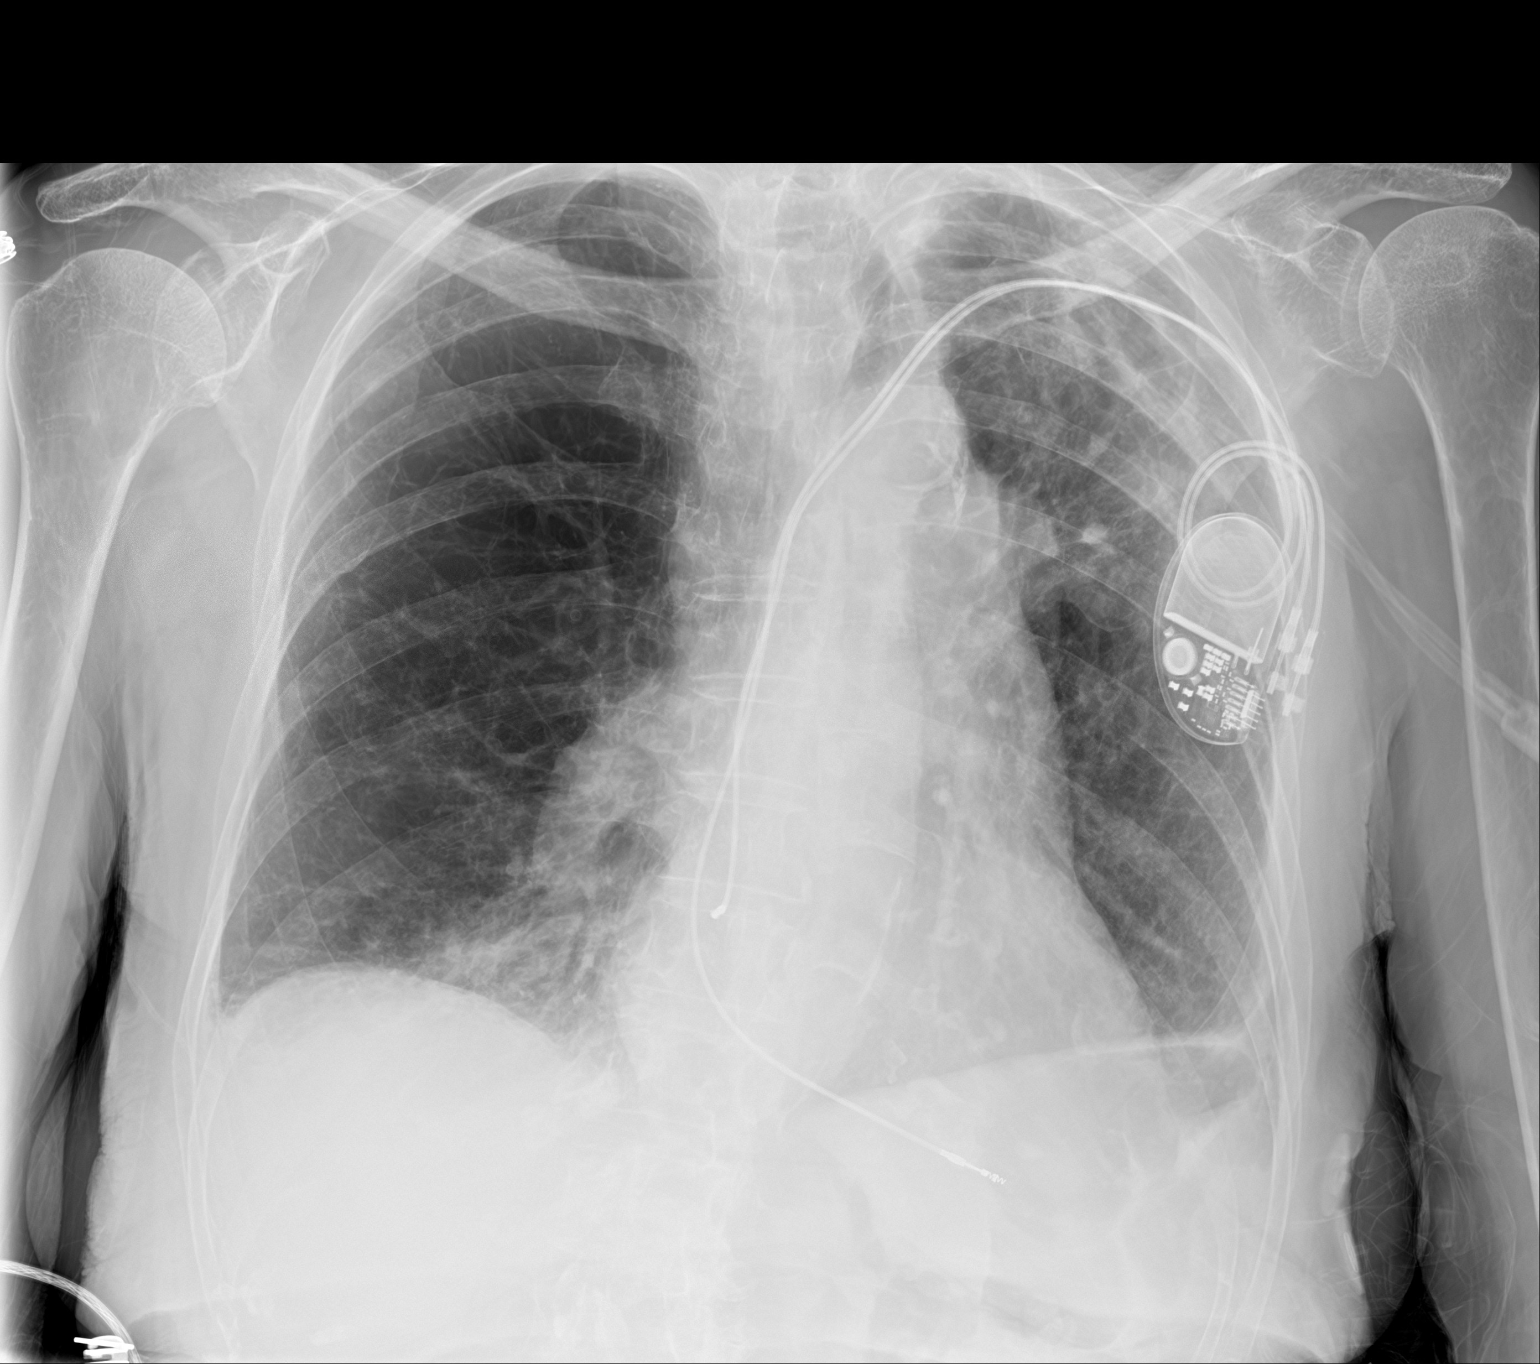

[2 of 2 positions shown; findings below may reference images not displayed]

FINDINGS: Dual lead cardiac pacemaker is seen with leads overlying the right
atrium and right ventricle.

Cardiomediastinal silhouette is normal. Mediastinal contours appear
intact.

There is no evidence of focal airspace consolidation, or
pneumothorax. There are increased interstitial markings on the
background of emphysematous changes. There are bilateral small
pleural effusions with associated subsegmental atelectasis.

There is a severe anterior compression deformity of 1 of the mid
thoracic vertebral bodies with unknown acuity. Multilevel
osteoarthritic changes are also seen. Soft tissues are grossly
normal.
IMPRESSION: Bilateral pleural effusions with bibasilar subsegmental atelectasis.

Mildly increased interstitial markings on the background of
emphysematous changes, which may represent an element of pulmonary
edema.

Anterior compression deformity of 1 of the mid thoracic vertebral
bodies with unknown acuity.

## 2017-12-30 IMAGING — DX DG CHEST 1V PORT
1 series · 1 of 1 positions shown · non-contrast
Comparison: 11/01/2015 CT and plain film

CLINICAL DATA: Sob increased today. Patient used dual nebulizer 4
times at home, no relief. Hx COPD, pna, AFib, htn

EXAM:
PORTABLE CHEST 1 VIEW

[chest ap]
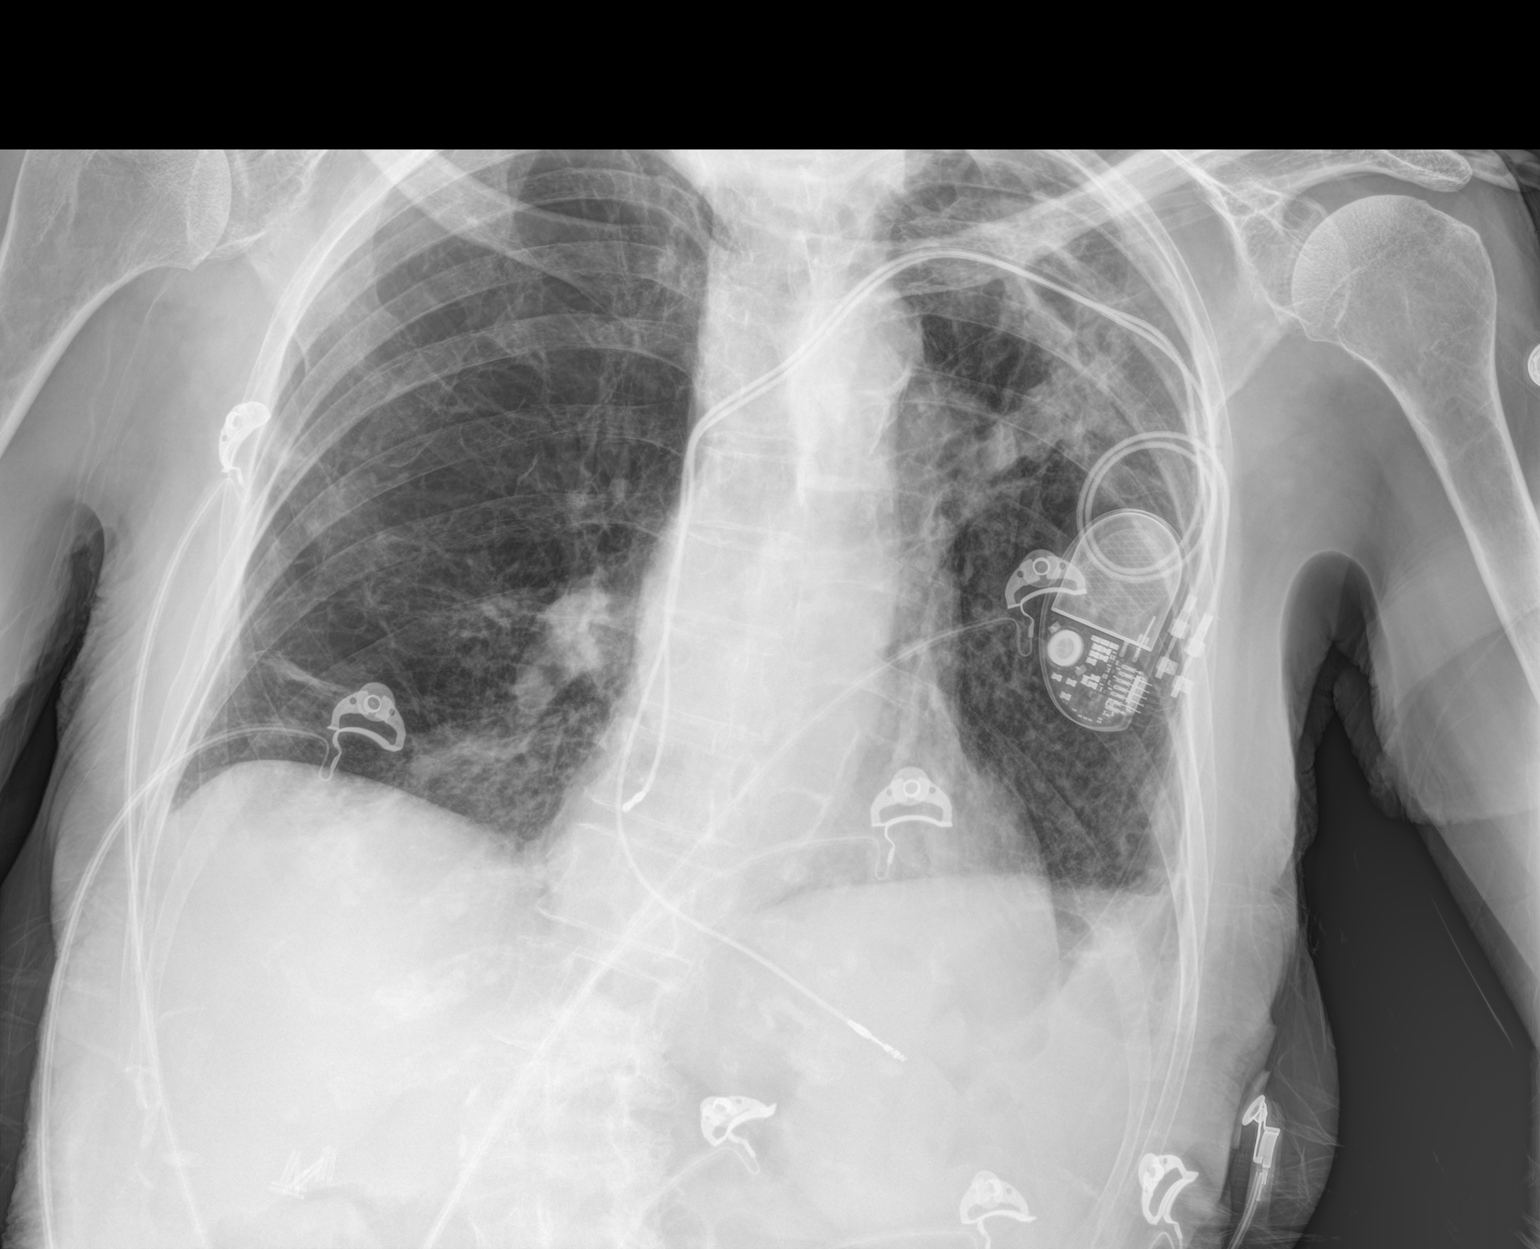

[1 of 1 positions shown; findings below may reference images not displayed]

FINDINGS: Lungs are hyperinflated. Again noted is complex cavitary lesion
within the left lung apex. Significant emphysematous changes most
notable at the right lung apex. Chronic lung markings at the medial
right lung base. No focal consolidations or pleural effusions are
identified.

Left-sided AICD leads to right atrium and right ventricle.
IMPRESSION: 1. Chronic changes including left apical cavitary lesion and
emphysematous changes.
2.  No evidence for acute cardiopulmonary abnormality.

## 2020-07-28 DEATH — deceased
